# Patient Record
Sex: Female | Born: 1975
Health system: Southern US, Community
[De-identification: ages and names within clinical notes are randomized; demographics above are authoritative.]

## PROBLEM LIST (undated history)

## (undated) ENCOUNTER — Emergency Department: Discharge status: Absent without leave | Payer: Self-pay

## (undated) DIAGNOSIS — F32A Depression, unspecified: Secondary | ICD-10-CM

## (undated) DIAGNOSIS — E079 Disorder of thyroid, unspecified: Secondary | ICD-10-CM

## (undated) DIAGNOSIS — F329 Major depressive disorder, single episode, unspecified: Secondary | ICD-10-CM

## (undated) HISTORY — PX: HERNIA REPAIR: SHX51

## (undated) HISTORY — PX: OTHER SURGICAL HISTORY: SHX169

## (undated) HISTORY — PX: APPENDECTOMY: SHX54

---

## 1997-06-29 ENCOUNTER — Encounter: Admission: RE | Admit: 1997-06-29 | Discharge: 1997-06-29 | Payer: Self-pay | Admitting: Hematology and Oncology

## 1997-12-06 ENCOUNTER — Emergency Department (HOSPITAL_COMMUNITY): Admission: EM | Admit: 1997-12-06 | Discharge: 1997-12-06 | Payer: Self-pay | Admitting: Emergency Medicine

## 1997-12-12 ENCOUNTER — Encounter: Payer: Self-pay | Admitting: Emergency Medicine

## 1997-12-12 ENCOUNTER — Emergency Department (HOSPITAL_COMMUNITY): Admission: EM | Admit: 1997-12-12 | Discharge: 1997-12-12 | Payer: Self-pay | Admitting: Emergency Medicine

## 1998-10-19 ENCOUNTER — Encounter: Admission: RE | Admit: 1998-10-19 | Discharge: 1998-10-19 | Payer: Self-pay | Admitting: Internal Medicine

## 1998-12-29 ENCOUNTER — Emergency Department (HOSPITAL_COMMUNITY): Admission: EM | Admit: 1998-12-29 | Discharge: 1998-12-29 | Payer: Self-pay | Admitting: Emergency Medicine

## 1999-01-23 ENCOUNTER — Encounter: Admission: RE | Admit: 1999-01-23 | Discharge: 1999-01-23 | Payer: Self-pay | Admitting: Internal Medicine

## 1999-02-22 ENCOUNTER — Emergency Department (HOSPITAL_COMMUNITY): Admission: EM | Admit: 1999-02-22 | Discharge: 1999-02-22 | Payer: Self-pay | Admitting: Emergency Medicine

## 1999-02-22 ENCOUNTER — Encounter: Payer: Self-pay | Admitting: Emergency Medicine

## 1999-07-03 ENCOUNTER — Emergency Department (HOSPITAL_COMMUNITY): Admission: EM | Admit: 1999-07-03 | Discharge: 1999-07-03 | Payer: Self-pay | Admitting: Emergency Medicine

## 1999-07-03 ENCOUNTER — Encounter: Payer: Self-pay | Admitting: Emergency Medicine

## 1999-11-26 ENCOUNTER — Inpatient Hospital Stay (HOSPITAL_COMMUNITY): Admission: AD | Admit: 1999-11-26 | Discharge: 1999-11-26 | Payer: Self-pay | Admitting: Obstetrics & Gynecology

## 1999-12-30 ENCOUNTER — Other Ambulatory Visit: Admission: RE | Admit: 1999-12-30 | Discharge: 1999-12-30 | Payer: Self-pay | Admitting: Obstetrics and Gynecology

## 2000-01-07 ENCOUNTER — Ambulatory Visit (HOSPITAL_COMMUNITY): Admission: RE | Admit: 2000-01-07 | Discharge: 2000-01-07 | Payer: Self-pay | Admitting: *Deleted

## 2000-01-07 ENCOUNTER — Encounter: Payer: Self-pay | Admitting: *Deleted

## 2000-01-14 ENCOUNTER — Encounter: Payer: Self-pay | Admitting: *Deleted

## 2000-01-14 ENCOUNTER — Ambulatory Visit (HOSPITAL_COMMUNITY): Admission: RE | Admit: 2000-01-14 | Discharge: 2000-01-14 | Payer: Self-pay | Admitting: *Deleted

## 2000-02-12 ENCOUNTER — Inpatient Hospital Stay (HOSPITAL_COMMUNITY): Admission: AD | Admit: 2000-02-12 | Discharge: 2000-02-12 | Payer: Self-pay | Admitting: Obstetrics and Gynecology

## 2000-02-12 ENCOUNTER — Encounter: Payer: Self-pay | Admitting: Emergency Medicine

## 2000-05-21 ENCOUNTER — Inpatient Hospital Stay (HOSPITAL_COMMUNITY): Admission: AD | Admit: 2000-05-21 | Discharge: 2000-05-21 | Payer: Self-pay | Admitting: Obstetrics and Gynecology

## 2000-06-09 ENCOUNTER — Encounter: Payer: Self-pay | Admitting: Obstetrics & Gynecology

## 2000-06-09 ENCOUNTER — Ambulatory Visit (HOSPITAL_COMMUNITY): Admission: RE | Admit: 2000-06-09 | Discharge: 2000-06-09 | Payer: Self-pay | Admitting: Obstetrics & Gynecology

## 2000-06-11 ENCOUNTER — Inpatient Hospital Stay (HOSPITAL_COMMUNITY): Admission: AD | Admit: 2000-06-11 | Discharge: 2000-06-15 | Payer: Self-pay | Admitting: Obstetrics and Gynecology

## 2000-06-17 ENCOUNTER — Observation Stay (HOSPITAL_COMMUNITY): Admission: AD | Admit: 2000-06-17 | Discharge: 2000-06-18 | Payer: Self-pay | Admitting: Obstetrics and Gynecology

## 2000-07-09 ENCOUNTER — Encounter: Admission: RE | Admit: 2000-07-09 | Discharge: 2000-08-08 | Payer: Self-pay | Admitting: Obstetrics and Gynecology

## 2001-02-10 ENCOUNTER — Emergency Department (HOSPITAL_COMMUNITY): Admission: EM | Admit: 2001-02-10 | Discharge: 2001-02-10 | Payer: Self-pay | Admitting: *Deleted

## 2008-02-19 ENCOUNTER — Ambulatory Visit: Payer: Self-pay | Admitting: Family Medicine

## 2008-02-19 DIAGNOSIS — N3000 Acute cystitis without hematuria: Secondary | ICD-10-CM | POA: Insufficient documentation

## 2008-02-19 DIAGNOSIS — E039 Hypothyroidism, unspecified: Secondary | ICD-10-CM | POA: Insufficient documentation

## 2008-02-19 DIAGNOSIS — F411 Generalized anxiety disorder: Secondary | ICD-10-CM | POA: Insufficient documentation

## 2008-02-19 LAB — CONVERTED CEMR LAB
Bilirubin Urine: NEGATIVE
Glucose, Urine, Semiquant: NEGATIVE
Ketones, urine, test strip: NEGATIVE
Nitrite: NEGATIVE
Protein, U semiquant: 30
Specific Gravity, Urine: 1.02
Urobilinogen, UA: 0.2
pH: 7

## 2008-02-20 ENCOUNTER — Encounter: Payer: Self-pay | Admitting: Family Medicine

## 2008-04-21 ENCOUNTER — Ambulatory Visit: Payer: Self-pay | Admitting: Family Medicine

## 2008-04-21 DIAGNOSIS — R209 Unspecified disturbances of skin sensation: Secondary | ICD-10-CM | POA: Insufficient documentation

## 2008-04-22 ENCOUNTER — Encounter: Payer: Self-pay | Admitting: Family Medicine

## 2008-04-25 ENCOUNTER — Telehealth (INDEPENDENT_AMBULATORY_CARE_PROVIDER_SITE_OTHER): Payer: Self-pay | Admitting: *Deleted

## 2008-04-25 LAB — CONVERTED CEMR LAB
BUN: 11 mg/dL (ref 6–23)
CO2: 25 meq/L (ref 19–32)
Calcium: 9.1 mg/dL (ref 8.4–10.5)
Chloride: 108 meq/L (ref 96–112)
Creatinine, Ser: 0.89 mg/dL (ref 0.40–1.20)
Folate: 18.2 ng/mL
Glucose, Bld: 79 mg/dL (ref 70–99)
Hgb A1c MFr Bld: 5.8 % (ref 4.6–6.1)
Potassium: 4.5 meq/L (ref 3.5–5.3)
Sodium: 141 meq/L (ref 135–145)
Vitamin B-12: 343 pg/mL (ref 211–911)

## 2009-01-11 ENCOUNTER — Ambulatory Visit: Payer: Self-pay | Admitting: Family Medicine

## 2009-01-11 DIAGNOSIS — J209 Acute bronchitis, unspecified: Secondary | ICD-10-CM | POA: Insufficient documentation

## 2009-04-24 ENCOUNTER — Ambulatory Visit: Payer: Self-pay | Admitting: Family Medicine

## 2009-04-24 DIAGNOSIS — J069 Acute upper respiratory infection, unspecified: Secondary | ICD-10-CM | POA: Insufficient documentation

## 2009-09-14 ENCOUNTER — Ambulatory Visit: Payer: Self-pay | Admitting: Emergency Medicine

## 2009-09-14 DIAGNOSIS — M79609 Pain in unspecified limb: Secondary | ICD-10-CM | POA: Insufficient documentation

## 2009-10-01 ENCOUNTER — Encounter: Admission: RE | Admit: 2009-10-01 | Discharge: 2009-10-25 | Payer: Self-pay | Admitting: Sports Medicine

## 2010-03-28 NOTE — Assessment & Plan Note (Signed)
Summary: UTI? ROOM 1   Vital Signs:  Patient Profile:   35 Years Old Female CC:      Dyuria, Frequency in urination x 1 day Height:     67 inches Weight:      267 pounds O2 Sat:      97 % O2 treatment:    Room Air Temp:     97.6 degrees F oral Pulse rate:   72 / minute Pulse rhythm:   regular Resp:     12 per minute BP sitting:   120 / 74  (left arm) Cuff size:   regular  Vitals Entered By: Avel SensorMandy Lerner, CMA                 History of Present Illness Chief Complaint: Dyuria, Frequency in urination x 1 day History of Present Illness: Subjective:  Patient presents complaining of UTI symptoms for 2 days.  Complains of dysuria, frequency, and urgency.  +  hematuria.   No abnormal vaginal discharge.  No fever/chills/sweats.  No abdominal pain.  No flank pain.  No nausea/vomiting.  Last menstrual period normal.     Current Meds SYNTHROID 125 MCG TABS (LEVOTHYROXINE SODIUM) 1 bid PAXIL 40 MG TABS (PAROXETINE HCL) 1 qd NITROFURANTOIN MONOHYD MACRO 100 MG CAPS (NITROFURANTOIN MONOHYD MACRO) 1 by mouth two times a day for one week PYRIDIUM 100 MG TABS (PHENAZOPYRIDINE HCL) 1 by mouth three times a day pc   REVIEW OF SYSTEMS Constitutional Symptoms      Denies fever, chills, night sweats, weight loss, weight gain, and fatigue.  Eyes       Denies change in vision, eye pain, eye discharge, glasses, contact lenses, and eye surgery. Ear/Nose/Throat/Mouth       Denies hearing loss/aids, change in hearing, ear pain, ear discharge, dizziness, frequent runny nose, frequent nose bleeds, sinus problems, sore throat, hoarseness, and tooth pain or bleeding.  Respiratory       Denies dry cough, productive cough, wheezing, shortness of breath, asthma, bronchitis, and emphysema/COPD.  Cardiovascular       Denies murmurs, chest pain, and tires easily with exhertion.    Gastrointestinal       Denies stomach pain, nausea/vomiting, diarrhea, constipation, blood in bowel movements, and  indigestion. Genitourniary       Complains of painful urination.      Denies kidney stones and loss of urinary control. Neurological       Denies paralysis, seizures, and fainting/blackouts. Musculoskeletal       Denies muscle pain, joint pain, joint stiffness, decreased range of motion, redness, swelling, muscle weakness, and gout.  Skin       Denies bruising, unusual mles/lumps or sores, and hair/skin or nail changes.  Psych       Denies mood changes, temper/anger issues, anxiety/stress, speech problems, depression, and sleep problems.  Past Medical History:    Anxiety    Hypothyroidism  Past Surgical History:    Appendectomy    Caesarean section   Family History:    Mother, Healthy    Father,D Lung CA    Brother, Healthy  Social History:    1 ppd Smoker    2 alcohol a day    No drugs  Objective:   No acute distress, appears comfortable. Mouth:  moist mucous membranes Lungs:  Clear to auscultation.  Breath sounds are equal.  Heart:  Regular rate and rhythm without murmurs, rubs, or gallops.  Abdomen:  No tenderness to deep palpation. No rebound or guarding.  Non-distended.  Active bowel sounds.  No CVA or flank tenderness.  No masses or hepatosplenomegaly.  Urinalysis reviewed.     Assessment New Problems: ACUTE CYSTITIS (ICD-595.0) HYPOTHYROIDISM (ICD-244.9) ANXIETY (ICD-300.00)   Plan New Medications/Changes: PYRIDIUM 100 MG TABS (PHENAZOPYRIDINE HCL) 1 by mouth three times a day pc  #6 x 0, 02/19/2008, Donna Christen MD NITROFURANTOIN MONOHYD MACRO 100 MG CAPS (NITROFURANTOIN MONOHYD MACRO) 1 by mouth two times a day for one week  #14 x 0, 02/19/2008, Donna Christen MD  New Orders: T-Culture, Urine [[id number]] T-Urinalysis Dipstick only [46962XB] New Patient Level II [28413] Planning Comments:   Urine culture pending   The patient and/or caregiver has been counseled thoroughly with regard to medications prescribed including dosage, schedule,  interactions, rationale for use, and possible side effects and they verbalize understanding.  Diagnoses and expected course of recovery discussed and will return if not improved as expected or if the condition worsens. Patient and/or caregiver verbalized understanding.    Prescriptions: PYRIDIUM 100 MG TABS (PHENAZOPYRIDINE HCL) 1 by mouth three times a day pc  #6 x 0   Entered and Authorized by:   Donna Christen MD   Signed by:   Donna Christen MD on 02/19/2008   Method used:   Print then Give to Patient   RxID:   [id number] NITROFURANTOIN MONOHYD MACRO 100 MG CAPS (NITROFURANTOIN MONOHYD MACRO) 1 by mouth two times a day for one week  #14 x 0   Entered and Authorized by:   Donna Christen MD   Signed by:   Donna Christen MD on 02/19/2008   Method used:   Print then Give to Patient   RxID:   [id number]    Patient Instructions: 1)  Increase fluid intake 2)  Return for recheck if not improving 4 to 5 days or if symptoms become worse.  ] ] Laboratory Results   Urine Tests  Date/Time Received: February 19, 2008 5:41 PM  Date/Time Reported: February 19, 2008 5:41 PM   Routine Urinalysis   Color: yellow Appearance: Clear Glucose: negative   (Normal Range: Negative) Bilirubin: negative   (Normal Range: Negative) Ketone: negative   (Normal Range: Negative) Spec. Gravity: 1.020   (Normal Range: 1.003-1.035) Blood: moderate   (Normal Range: Negative) pH: 7.0   (Normal Range: 5.0-8.0) Protein: 30   (Normal Range: Negative) Urobilinogen: 0.2   (Normal Range: 0-1) Nitrite: negative   (Normal Range: Negative) Leukocyte Esterace: large   (Normal Range: Negative)      Emilio Math  February 19, 2008 5:42 PM

## 2010-03-28 NOTE — Assessment & Plan Note (Signed)
Summary: SORE THROAT, COUGH, CONGESTION/TJ   Vital Signs:  Patient Profile:   35 Years Old Female CC:      sore throat, productive cough, runny nose, bilateral ear pain X 6 days Height:     67 inches Weight:      246 pounds O2 Sat:      97 % O2 treatment:    Room Air Temp:     98.1 degrees F oral Pulse rate:   69 / minute Pulse rhythm:   regular Resp:     16 per minute BP sitting:   144 / 85  (right arm) Cuff size:   regular  Pt. in pain?   no  Vitals Entered By: Lajean SaverKelsey Lambert RN (April 24, 2009 4:49 PM)                   Updated Prior Medication List: SYNTHROID 125 MCG TABS (LEVOTHYROXINE SODIUM) 1 bid PAXIL 40 MG TABS (PAROXETINE HCL) 1 qd WELLBUTRIN SR 150 MG XR12H-TAB (BUPROPION HCL) 1 qd  Current Allergies (reviewed today): ! PENICILLIN ! SULFA ! AMOXICILLINHistory of Present Illness Chief Complaint: sore throat, productive cough, runny nose, bilateral ear pain X 6 days History of Present Illness: Subjective: Patient complains of URI symptoms that started about one week ago.   + mild sore throat, improved + cough worse at night No pleuritic pain ? wheezing + nasal congestion + post-nasal drainage ? sinus pain/pressure No itchy/red eyes No earache No hemoptysis Mild SOB No fever, + chills No nausea No vomiting No abdominal pain No diarrhea No skin rashes + fatigue No myalgias No headache Used OTC meds without relief   REVIEW OF SYSTEMS Constitutional Symptoms       Complains of chills, night sweats, and fatigue.     Denies fever, weight loss, and weight gain.  Eyes       Denies change in vision, eye pain, eye discharge, glasses, contact lenses, and eye surgery. Ear/Nose/Throat/Mouth       Complains of ear pain, frequent runny nose, sinus problems, and sore throat.      Denies hearing loss/aids, change in hearing, ear discharge, dizziness, frequent nose bleeds, hoarseness, and tooth pain or bleeding.  Respiratory       Complains of productive  cough and wheezing.      Denies dry cough, shortness of breath, asthma, bronchitis, and emphysema/COPD.  Cardiovascular       Denies murmurs, chest pain, and tires easily with exhertion.    Gastrointestinal       Denies stomach pain, nausea/vomiting, diarrhea, constipation, blood in bowel movements, and indigestion. Genitourniary       Denies painful urination, kidney stones, and loss of urinary control. Neurological       Denies paralysis, seizures, and fainting/blackouts. Musculoskeletal       Denies muscle pain, joint pain, joint stiffness, decreased range of motion, redness, swelling, muscle weakness, and gout.  Skin       Denies bruising, unusual mles/lumps or sores, and hair/skin or nail changes.  Psych       Denies mood changes, temper/anger issues, anxiety/stress, speech problems, depression, and sleep problems.  Past History:  Past Medical History: Anxiety Hypothyroidism frequent bronchitis  Past Surgical History: Appendectomy Caesarean section Inguinal herniorrhaphy  Family History: Reviewed history from 02/19/2008 and no changes required. Mother, Healthy Father,D Lung CA Brother, Healthy  Social History: 1 ppd Smoker 1 alcohol per week No drugs   Objective:  Appearance:  Patient appears healthy, stated  age, and in no acute distress  Eyes:  Pupils are equal, round, and reactive to light and accomdation.  Extraocular movement is intact.  Conjunctivae are not inflamed.  Ears:  Canals normal.  Tympanic membranes normal.   Nose:  Normal septum.  Normal turbinates, mildly congested.  No sinus tenderness present.  Pharynx:  Normal  Neck:  Supple.  No adenopathy is present.  No thyromegaly is present  Lungs:   Faint scattered wheezes posteriorly.   Breath sounds are equal.  Heart:  Regular rate and rhythm without murmurs, rubs, or gallops.  Abdomen:  Nontender without masses or hepatosplenomegaly.  Bowel sounds are present.  No CVA or flank tenderness.      Assessment New Problems: URI (ICD-465.9)   Plan New Medications/Changes: BENZONATATE 200 MG CAPS (BENZONATATE) One by mouth hs as needed cough  #12 x 0, 04/24/2009, Donna Christen MD AZITHROMYCIN 250 MG TABS (AZITHROMYCIN) Two tabs by mouth on day 1, then 1 tab daily on days 2 through 5  #6 tabs x 0, 04/24/2009, Donna Christen MD  New Orders: Est. Patient Level III [phone] Planning Comments:   Begin Z-pack, expectorant/decongestant, rest, fluids, cough suppressant at bedtime. Follow-up with PCP if not improving.   The patient and/or caregiver has been counseled thoroughly with regard to medications prescribed including dosage, schedule, interactions, rationale for use, and possible side effects and they verbalize understanding.  Diagnoses and expected course of recovery discussed and will return if not improved as expected or if the condition worsens. Patient and/or caregiver verbalized understanding.  Prescriptions: BENZONATATE 200 MG CAPS (BENZONATATE) One by mouth hs as needed cough  #12 x 0   Entered and Authorized by:   Donna Christen MD   Signed by:   Donna Christen MD on 04/24/2009   Method used:   Print then Give to Patient   RxID:   [id number] AZITHROMYCIN 250 MG TABS (AZITHROMYCIN) Two tabs by mouth on day 1, then 1 tab daily on days 2 through 5  #6 tabs x 0   Entered and Authorized by:   Donna Christen MD   Signed by:   Donna Christen MD on 04/24/2009   Method used:   Print then Give to Patient   RxID:   [id number]   Patient Instructions: 1)  May use Mucinex D (guaifenesin with decongestant) twice daily for congestion. 2)  Increase fluid intake, rest. 3)  May use Afrin nasal spray (or generic oxymetazoline) twice daily for about 5 days.  Also recommend using saline nasal spray several times daily and/or saline nasal irrigation. 4)  Followup with family doctor if not improving one week.

## 2010-03-28 NOTE — Progress Notes (Signed)
  Phone Note Outgoing Call   Call placed by: Loel Ro, RN Call placed to: [employer] Summary of Call: Call made to Ms. Windt at request of Dr. Cathren Harsh.  No answer.  Left message for patient to return call.    Per Dr. Cathren Harsh, at this point, unable to add CBC to blood previously collected.  Per Dr. Cathren Harsh, patient can come into office and have blood re-drawn for CBC or can wait until her apt with Neurologist and have blood work done as ordered by Insurance account manager.

## 2010-03-28 NOTE — Letter (Signed)
Summary: Out of School  [facility]  [address]   [place], [place] [address]   Phone: [phone]  Fax: [phone]    January 11, 2009   Student:  Tracy Carr    To Whom It May Concern:   For Medical reasons, please excuse the above named student from school for the following dates:  Start:   January 11, 2009  End:    15 Jan 2009  If you need additional information, please feel free to contact our office.   Sincerely,    Marvis Moeller DO    ****This is a legal document and cannot be tampered with.  Schools are authorized to verify all information and to do so accordingly.
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
## 2010-03-28 NOTE — Assessment & Plan Note (Signed)
Summary: NUMB FINGERS/KH   Vital Signs:  Patient Profile:   35 Years Old Female CC:      Left hand all finger numb, Rt hand thumd and 1st finger X 3 weeks Height:     67 inches Weight:      264 pounds O2 Sat:      97 % O2 treatment:    Room Air Temp:     99.4 degrees F oral Pulse rate:   80 / minute Pulse rhythm:   regular Resp:     12 per minute BP sitting:   145 / 90  (right arm) Cuff size:   large  Vitals Entered By: Emilio Math (April 21, 2008 11:00 AM)                 History of Present Illness Chief Complaint: Left hand all finger numb, Rt hand thumd and 1st finger X 3 weeks History of Present Illness: Subjective:  Patient complains of several month history of vague tingling sensation in her left hand thumb and all fingertips, worse in the thumb.  In her right hand she has had tingling in the thumb and 2nd/3rd fingers.  She reports long history (years) of tingling in her great toes.  For the past several weeks she also has had a vague sense of vibration in her anterior thighs.  She feels well otherwise.  She denies increased thirs or polyuria although she states that she drinks a lot of water.  She has a history of hypothyroid but states that her TSH has been normal on her present dose of Synthroid.  Remainder of ROS is negative.  She has a family history of diabetes in her mother.  Current Meds SYNTHROID 125 MCG TABS (LEVOTHYROXINE SODIUM) 1 bid PAXIL 40 MG TABS (PAROXETINE HCL) 1 qd WELLBUTRIN SR 150 MG XR12H-TAB (BUPROPION HCL) 1 qd   REVIEW OF SYSTEMS Constitutional Symptoms      Denies fever, chills, night sweats, weight loss, weight gain, and fatigue.  Eyes       Denies change in vision, eye pain, eye discharge, glasses, contact lenses, and eye surgery. Ear/Nose/Throat/Mouth       Denies hearing loss/aids, change in hearing, ear pain, ear discharge, dizziness, frequent runny nose, frequent nose bleeds, sinus problems, sore throat, hoarseness, and tooth pain  or bleeding.  Respiratory       Denies dry cough, productive cough, wheezing, shortness of breath, asthma, bronchitis, and emphysema/COPD.  Cardiovascular       Denies murmurs, chest pain, and tires easily with exhertion.    Gastrointestinal       Denies stomach pain, nausea/vomiting, diarrhea, constipation, blood in bowel movements, and indigestion. Genitourniary       Denies painful urination, kidney stones, and loss of urinary control. Neurological       Complains of headaches and numbness.      Denies paralysis, seizures, and fainting/blackouts. Musculoskeletal       Denies muscle pain, joint pain, joint stiffness, decreased range of motion, redness, swelling, muscle weakness, and gout.  Skin       Denies bruising, unusual mles/lumps or sores, and hair/skin or nail changes.  Psych       Denies mood changes, temper/anger issues, anxiety/stress, speech problems, depression, and sleep problems.  Past Medical History:    Reviewed history from 02/19/2008 and no changes required:       Anxiety       Hypothyroidism  Past Surgical History:    Reviewed history  from 02/19/2008 and no changes required:       Appendectomy       Caesarean section   Family History:    Reviewed history from 02/19/2008 and no changes required:       Mother, Healthy       Father,D Lung CA       Brother, Healthy  Social History:    Reviewed history from 02/19/2008 and no changes required:       1 ppd Smoker       2 alcohol a day       No drugs  Objective:  No acute distress.  BMI 41.4 Eyes:  Pupils are equal, round, and reactive to light and accomdation.  Extraocular movement is intact.  Conjunctivae are not inflamed.  Pharynx:  normal Neck:  Supple.  No adenopathy is present.   Lungs:  Clear to auscultation.  Breath sounds are equal.  Heart:  Regular rate and rhythm without murmurs, rubs, or gallops.  Abdomen:  Nontender without masses or hepatosplenomegaly.  Bowel sounds are present.  No CVA or  flank tenderness.  Neurologic:  Cranial nerves 2 through 12 are normal.  Patellar reflexes are normal.  Cerebellar function is intact.    There is decreased sensation to light touch in her great toes, distal thumbs bilaterally, 2nd and 3rd fingertips right hand, and 2nd through 5th fingertips left hand. Extremities:  pulses intact    Assessment New Problems: DISTURBANCE OF SKIN SENSATION (ICD-782.0)  Suspect diabetic neuropathy.  Plan New Orders: Est. Patient Level III [99213] T-Basic Metabolic Panel [80048-22910] T-Hgb A1C [83036-23375] T-B12, Serum Total Only [82607] T-Folate [23340] Planning Comments:   Check BMP. (patient is about 2 hours PC), Hgb A1C, B12 and folate levels Recommend weight loss.  Continue and increase regular physical activity. follow-up with family doctor.   The patient and/or caregiver has been counseled thoroughly with regard to medications prescribed including dosage, schedule, interactions, rationale for use, and possible side effects and they verbalize understanding.  Diagnoses and expected course of recovery discussed and will return if not improved as expected or if the condition worsens. Patient and/or caregiver verbalized understanding.     ] ]

## 2010-03-28 NOTE — Assessment & Plan Note (Signed)
Summary: Cough-dry, chest congestion, sinus pressure x 8-9 dys rm 2   Vital Signs:  Patient Profile:   35 Years Old Female CC:      Cold & URI symptoms Height:     67 inches Weight:      244 pounds O2 Sat:      98 % O2 treatment:    Room Air Temp:     97.7 degrees F oral Pulse rate:   76 / minute Pulse rhythm:   regular Resp:     16 per minute BP sitting:   125 / 85  (right arm) Cuff size:   regular  Vitals Entered By: Areta Haber CMA (January 11, 2009 12:17 PM)                  Current Allergies (reviewed today): ! PENICILLIN ! SULFA ! AMOXICILLIN   History of Present Illness Chief Complaint: Cold & URI symptoms History of Present Illness: ONSET 1 WK AGO WITH COUGH AND CONGESTION. COUGH IS DRY AND WORSE AT NIGHT. HAS TAKEN MUCINEX WITH MINIMAL RELEIF. NO FEVER. HAS CHROINC SINUS PROBLEMS. NO SORE THROAT. CHEST HURTS WITH COUGHING. NO N/V/D. ADMITS TO FREQUENT SWEATS.  Current Problems: BRONCHITIS, ACUTE (ICD-466.0) DISTURBANCE OF SKIN SENSATION (ICD-782.0) ACUTE CYSTITIS (ICD-595.0) HYPOTHYROIDISM (ICD-244.9) ANXIETY (ICD-300.00)   Current Meds SYNTHROID 125 MCG TABS (LEVOTHYROXINE SODIUM) 1 bid PAXIL 40 MG TABS (PAROXETINE HCL) 1 qd WELLBUTRIN SR 150 MG XR12H-TAB (BUPROPION HCL) 1 qd ZITHROMAX Z-PAK 250 MG TABS (AZITHROMYCIN) TAKE AS DIRECTED * ALBUTEROL INHALER HFA 2 PUFFS QID as needed WHEEZING CHERATUSSIN AC 100-10 MG/5ML SYRP (GUAIFENESIN-CODEINE) 1-2 TSP by mouth Q 6 HRS as needed COUGH MEDROL (PAK) 4 MG TABS (METHYLPREDNISOLONE) TAKE AS DIRECTED WITH FOOD  REVIEW OF SYSTEMS Constitutional Symptoms      Denies fever, chills, night sweats, weight loss, weight gain, and fatigue.  Eyes       Denies change in vision, eye pain, eye discharge, glasses, contact lenses, and eye surgery. Ear/Nose/Throat/Mouth       Complains of frequent runny nose and sinus problems.      Denies hearing loss/aids, change in hearing, ear pain, ear discharge, dizziness,  frequent nose bleeds, sore throat, hoarseness, and tooth pain or bleeding.      Comments: x 8-9 dys Respiratory       Complains of dry cough and wheezing.      Denies productive cough, shortness of breath, asthma, bronchitis, and emphysema/COPD.  Cardiovascular       Denies murmurs, chest pain, and tires easily with exhertion.    Gastrointestinal       Denies stomach pain, nausea/vomiting, diarrhea, constipation, blood in bowel movements, and indigestion. Genitourniary       Denies painful urination, kidney stones, and loss of urinary control. Neurological       Denies paralysis, seizures, and fainting/blackouts. Musculoskeletal       Denies muscle pain, joint pain, joint stiffness, decreased range of motion, redness, swelling, muscle weakness, and gout.  Skin       Denies bruising, unusual mles/lumps or sores, and hair/skin or nail changes.  Psych       Denies mood changes, temper/anger issues, anxiety/stress, speech problems, depression, and sleep problems.  Past History:  Past Medical History: Last updated: 02/19/2008 Anxiety Hypothyroidism  Past Surgical History: Last updated: 02/19/2008 Appendectomy Caesarean section  Family History: Last updated: 02/19/2008 Mother, Healthy Father,D Lung CA Brother, Healthy  Social History: Last updated: 02/19/2008 1 ppd Smoker 2 alcohol a day  No drugs Physical Exam General appearance: well developed, well nourished, no acute distress Ears: normal, no lesions or deformities Nasal: CONGESTED Oral/Pharynx: MILD ERYTHEMA Neck: neck supple,  trachea midline, no masses Chest/Lungs: no rales, wheezes, or rhonchi bilateral, breath sounds equal without effort. CONGESTED CROUPY COUGH Heart: regular rate and  rhythm, no murmur Extremities: normal extremities Skin: no obvious rashes or lesions Assessment New Problems: BRONCHITIS, ACUTE (ICD-466.0)   Plan New Medications/Changes: MEDROL (PAK) 4 MG TABS (METHYLPREDNISOLONE) TAKE AS  DIRECTED WITH FOOD  #1 x 0, 01/11/2009, Rafael Quesada DO CHERATUSSIN AC 100-10 MG/5ML SYRP (GUAIFENESIN-CODEINE) 1-2 TSP by mouth Q 6 HRS as needed COUGH  #4 OZ x 0, 01/11/2009, Samari Gorby DO ALBUTEROL INHALER HFA 2 PUFFS QID as needed WHEEZING  #ONE x 0, 01/11/2009, Glady Ouderkirk DO ZITHROMAX Z-PAK 250 MG TABS (AZITHROMYCIN) TAKE AS DIRECTED  #1 PK x 0, 01/11/2009, Marvis MoellerKimberly Ainsley Deakins DO  New Orders: Est. Patient Level III [16109][99213]   Prescriptions: MEDROL (PAK) 4 MG TABS (METHYLPREDNISOLONE) TAKE AS DIRECTED WITH FOOD  #1 x 0   Entered and Authorized by:   Marvis MoellerKimberly Meylin Stenzel DO   Signed by:   Marvis MoellerKimberly Calib Wadhwa DO on 01/11/2009   Method used:   Print then Give to Patient   RxID:   [id number] CHERATUSSIN AC 100-10 MG/5ML SYRP (GUAIFENESIN-CODEINE) 1-2 TSP by mouth Q 6 HRS as needed COUGH  #4 OZ x 0   Entered and Authorized by:   Marvis MoellerKimberly Keionna Kinnaird DO   Signed by:   Marvis MoellerKimberly Kitrina Maurin DO on 01/11/2009   Method used:   Print then Give to Patient   RxID:   [id number] ALBUTEROL INHALER HFA 2 PUFFS QID as needed WHEEZING  #ONE x 0   Entered and Authorized by:   Marvis MoellerKimberly Danilo Cappiello DO   Signed by:   Marvis MoellerKimberly Jaycey Gens DO on 01/11/2009   Method used:   Print then Give to Patient   RxID:   [id number] ZITHROMAX Z-PAK 250 MG TABS (AZITHROMYCIN) TAKE AS DIRECTED  #1 PK x 0   Entered and Authorized by:   Marvis MoellerKimberly Brynlei Klausner DO   Signed by:   Marvis MoellerKimberly Brissa Asante DO on 01/11/2009   Method used:   Print then Give to Patient   Rx:   [id number]:   [id number]   Patient Instructions: 1)  TYLENOL OR MOTRIN AS NEEDED. AVOID CAFFEINE AND MILK PRODUCTS. FOLLOW UP WITH YOUR PCP OR RETURN IF SYMPTOMS FAIL TO RESOLVE OR WORSEN.

## 2010-07-12 NOTE — Op Note (Signed)
[facility]  Patient:    Tracy Carr, Tracy Carr                     MRN: [id number] Proc. Date: 06/11/00 Adm. Date:  [id number] Attending:  Miguel Aschoff                           Operative Report  PREOPERATIVE DIAGNOSIS:       Intrauterine pregnancy at 38 weeks with suspected macrosomia.  POSTOPERATIVE DIAGNOSIS:      Intrauterine pregnancy at 38 weeks with suspected macrosomia with viable female infant, Apgars 8/9.  OPERATION:                    Primary low transverse cesarean section.  SURGEON:                      Miguel Aschoff, M.D.  ASSISTANT:                    Gerrit Friends. Aldona Bar, M.D.  ANESTHESIA:                   Spinal.  COMPLICATIONS:                None.  JUSTIFICATION:                The patient is a 35 year old white female at [redacted] weeks gestation, noted to have a markedly large uterine fundus.  Ultrasound examination was carried out and revealed an estimated fetal weight of approximately 10 pounds.  In view of the suspected macrosomia, the options of the delivery route were discussed with the patient, and the patient elected to undergo elective primary cesarean section for the suspected macrosomia.  Risks and benefits have been discussed.  DESCRIPTION OF PROCEDURE:     The patient was taken to the operating room, placed in the sitting position, and spinal anesthesia was administered without difficulty.  She was then placed in supine position deviated to the left and prepped and draped in the usual sterile fashion.  After this was done, a Pfannenstiel incision was made, extended down through the subcutaneous tissue with bleeding points being clamped and coagulated as they were encountered. The fascia was then identified and incised transversely.  The fascia was then separated from the underlying rectus muscles.  The rectus muscles were divided in the midline, and the peritoneum was then identified and entered carefully, avoiding underlying  structures.  A bladder flap was then created and protected with the bladder blade.  A elliptical transverse incision was made in the lower uterine segment.  The amniotic cavity was entered.  Clear fluid was obtained, and then the patient was delivered of a viable female infant, Apgars 8 at 1 minute and 9 at 5 minutes from a vertex LOA position.  Nuchal cord x 1 was noted.  The babys weight was 9 pounds 10 ounces.  After delivery of the infant, cord was doubly clamped and cut, and the baby was handed to the pediatric team in attendance.  Cord bloods were then obtained for appropriate studies.  The placenta was then delivered, and the uterus was evacuated of any products of conception.  The uterine incision was then ligated using figure-of-eight sutures of #1 Vicryl and the uterus was closed in layers.  The first layer was running interlocking suture of #1 Vicryl followed by an imbricating suture of #1  Vicryl.  Then the bladder flap was reapproximated using running continuous 2-0 Vicryl suture.  After this was done, there appeared to be excellent hemostasis.  The abdomen was irrigated with warm saline.  Tubes and ovaries were inspected and noted to be within normal limits, and then the abdomen was closed.  The parietal peritoneum was closed using running continuous 0 Vicryl suture.  The rectus muscles were reapproximated using running continuous 0 Vicryl suture.  The fascia was then closed using two sutures of 0 Vicryl, each starting at the lateral fascial angles and meeting at the midline.  Subcutaneous tissue was closed using interrupted 0 Vicryl suture, and then the skin incision was closed using staples.  The estimated blood loss was approximately 800 cc.  The patient tolerated the procedure well and went to the recovery room in satisfactory condition. DD:  06/11/00 TD:  06/11/00 Job: [id number]

## 2010-07-12 NOTE — Discharge Summary (Signed)
[facility]  Patient:    Tracy Carr, Tracy Carr                     MRN: [id number] Adm. Date:  [id number] Disc. Date: [id number] Attending:  Osborn CohoAnderson, Mark Edward                           Discharge Summary  PREOPERATIVE DIAGNOSES:       Intrauterine pregnancy at term, suspected macrosomia.  FINAL DIAGNOSES:              Intrauterine pregnancy at term, suspected macrosomia with delivery of viable female infant Apgars 8/9 weighing 9 pounds 10 ounces.  PROCEDURE:                    Primary low flap transverse cesarean section, spinal anesthesia.  BRIEF HISTORY:                The patient is a 35 year old white female gravida 2, para 0-1-0-0 who was noted to have a large uterine fundus and ultrasound examination was carried out which revealed suspected macrosomia. The patient was given the options of an attempt at vaginal birth versus an elective primary cesarean section.  After being counseled as to the risks and benefits of each procedure the patient has elected to undergo primary cesarean section.  HOSPITAL COURSE:              Preoperative studies were obtained.  This revealed admission hemoglobin of 10.0, white count of 10,200.  PT and PTT were within normal limits.  Chemistry profile showed elevation of the alkaline phosphatase at 152.  Other laboratories were normal except for an elevated TSH at 17.8.  On April 18 under spinal anesthesia a primary low flap transverse cesarean section was carried out with delivery of a viable female infant Apgar 8 at one minute and 9 at five minutes.  Babys weight was 9 pounds 10 ounces. The patients postoperative course was essentially uncomplicated.  She tolerated increasing ambulation and diet well.  Hemoglobin did drop to 9.1 and the patient was started on iron therapy.  By April 22 the patient was in satisfactory condition and could be discharged home.  DISCHARGE MEDICATIONS:        1. Ferrous sulfate 325 mg one  b.i.d.                               2. Tylox one q.3h. p.r.n. pain.  DISCHARGE INSTRUCTIONS:       No heavy lifting.  Placing nothing in the vagina.  Call if there are any problems such as fever, pain, or heavy bleeding.  FOLLOW-UP:                    The patient will be seen back in the office in four weeks for followup examination.  At that time she will also have evaluation of her thyroid status carried out. DD:  07/14/00 TD:  07/14/00 Job: [id number]

## 2010-11-30 ENCOUNTER — Encounter: Payer: Self-pay | Admitting: Family Medicine

## 2010-11-30 ENCOUNTER — Inpatient Hospital Stay (INDEPENDENT_AMBULATORY_CARE_PROVIDER_SITE_OTHER)
Admission: RE | Admit: 2010-11-30 | Discharge: 2010-11-30 | Disposition: A | Discharge status: Home / Self Care | Payer: Medicaid Other | Source: Ambulatory Visit | Attending: Family Medicine | Admitting: Family Medicine

## 2010-11-30 DIAGNOSIS — J209 Acute bronchitis, unspecified: Secondary | ICD-10-CM

## 2011-01-27 NOTE — Progress Notes (Signed)
Summary: bronchitis?/TM   Vital Signs:  Patient Profile:   35 Years Old Female CC:      Cough Height:     67 inches Weight:      267 pounds O2 Sat:      98 % O2 treatment:    Room Air Temp:     98.3 degrees F oral Pulse rate:   57 / minute Resp:     18 per minute BP sitting:   139 / 84  (left arm) Cuff size:   large  Vitals Entered By: Linton Flemings RN (November 30, 2010 10:05 AM)                  Updated Prior Medication List: SYNTHROID 125 MCG TABS (LEVOTHYROXINE SODIUM) 1 bid PAXIL 40 MG TABS (PAROXETINE HCL) 1 qd WELLBUTRIN SR 150 MG XR12H-TAB (BUPROPION HCL) 1 qd  Current Allergies (reviewed today): ! PENICILLIN ! SULFA ! AMOXICILLINHistory of Present Illness Chief Complaint: Cough History of Present Illness: 35 yo F smoker with reports of several episodes bronchitis, pneumonia in past presents with 1 week of dry cough and fatigue, night sweats.  No fever.  + wheezing.  No shortness of breath or chest pain.  No known sick contacts  REVIEW OF SYSTEMS Constitutional Symptoms      Denies fever, chills, night sweats, weight loss, weight gain, and fatigue.  Eyes       Denies change in vision, eye pain, eye discharge, glasses, contact lenses, and eye surgery. Ear/Nose/Throat/Mouth       Denies hearing loss/aids, change in hearing, ear pain, ear discharge, dizziness, frequent runny nose, frequent nose bleeds, sinus problems, sore throat, hoarseness, and tooth pain or bleeding.  Respiratory       Complains of dry cough, wheezing, and bronchitis.      Denies productive cough, shortness of breath, asthma, and emphysema/COPD.  Cardiovascular       Complains of tires easily with exhertion.      Denies murmurs and chest pain.    Gastrointestinal       Denies stomach pain, nausea/vomiting, diarrhea, constipation, blood in bowel movements, and indigestion. Genitourniary       Denies painful urination, kidney stones, and loss of urinary control. Neurological       Denies  paralysis, seizures, and fainting/blackouts. Musculoskeletal       Denies muscle pain, joint pain, joint stiffness, decreased range of motion, redness, swelling, muscle weakness, and gout.  Skin       Denies bruising, unusual mles/lumps or sores, and hair/skin or nail changes.  Psych       Denies mood changes, temper/anger issues, anxiety/stress, speech problems, depression, and sleep problems. Other Comments: cough started Saturday   Past History:  Past Surgical History: Last updated: 04/24/2009 Appendectomy Caesarean section Inguinal herniorrhaphy  Family History: Last updated: 02/19/2008 Mother, Healthy Father,D Lung CA Brother, Healthy  Social History: Last updated: 09/14/2009 1/2 ppd Smoker 1 alcohol per week No drugs  Past Medical History: Reviewed history from 09/14/2009 and no changes required. Anxiety Hypothyroidism frequent bronchitis plantar fasciitis  Family History: Reviewed history from 02/19/2008 and no changes required. Mother, Healthy Father,D Lung CA Brother, Healthy  Social History: Reviewed history from 09/14/2009 and no changes required. 1/2 ppd Smoker 1 alcohol per week No drugs Physical Exam General appearance: well developed, well nourished, no acute distress, coughing Head: normocephalic, atraumatic Eyes: conjunctivae and lids normal Pupils: equal, round, reactive to light Ears: normal, no lesions or deformities Nasal: mucosa  pink, nonedematous, no septal deviation, turbinates normal Oral/Pharynx: tongue normal, posterior pharynx without erythema or exudate Neck: neck supple,  trachea midline, no masses Chest/Lungs: minimal wheezes.  No rales or rhonchi bilateral, breath sounds equal without effort.  pulse ox 98% Heart: regular rate and  rhythm, no murmur Assessment New Problems: BRONCHITIS, ACUTE (ICD-466.0)   Plan New Orders: Est. Patient Level III [40981] Planning Comments:   Albuterol neb given today for mild wheezes -  reports improvement, decrease in cough.  Tussionex as needed for cough.  Azithromycin x 5 days.  Discussed OTC medications and handout provided.   The patient and/or caregiver has been counseled thoroughly with regard to medications prescribed including dosage, schedule, interactions, rationale for use, and possible side effects and they verbalize understanding.  Diagnoses and expected course of recovery discussed and will return if not improved as expected or if the condition worsens. Patient and/or caregiver verbalized understanding.   Patient Instructions: 1)  Take antibiotics completely until gone. 2)  Tussionex twice a day as needed for cough - no driving on this medicine as it has a narcotic in it (most people take just at bedtime or if you know you're not going to be driving that day). 3)  Drink plenty of fluids and stay hydrated. 4)  See other handout for other over the counter remedies.  Orders Added: 1)  Est. Patient Level III [19147]  Appended Document: bronchitis?/TM aalbuterol neb tx. lot- [id number] EXP.March 2014 manufactured- [facility] [employer].

## 2011-02-06 ENCOUNTER — Emergency Department
Admit: 2011-02-06 | Discharge: 2011-02-06 | Disposition: A | Discharge status: Home / Self Care | Payer: Medicaid Other | Attending: Family Medicine | Admitting: Family Medicine

## 2011-02-06 ENCOUNTER — Encounter: Payer: Self-pay | Admitting: Emergency Medicine

## 2011-02-06 ENCOUNTER — Emergency Department
Admission: EM | Admit: 2011-02-06 | Discharge: 2011-02-06 | Disposition: A | Discharge status: Home / Self Care | Payer: Self-pay | Source: Home / Self Care | Attending: Family Medicine | Admitting: Family Medicine

## 2011-02-06 DIAGNOSIS — J209 Acute bronchitis, unspecified: Secondary | ICD-10-CM

## 2011-02-06 DIAGNOSIS — J111 Influenza due to unidentified influenza virus with other respiratory manifestations: Secondary | ICD-10-CM

## 2011-02-06 DIAGNOSIS — R6889 Other general symptoms and signs: Secondary | ICD-10-CM

## 2011-02-06 HISTORY — DX: Disorder of thyroid, unspecified: E07.9

## 2011-02-06 HISTORY — DX: Major depressive disorder, single episode, unspecified: F32.9

## 2011-02-06 HISTORY — DX: Depression, unspecified: F32.A

## 2011-02-06 LAB — POCT CBC W AUTO DIFF (K'VILLE URGENT CARE)

## 2011-02-06 MED ORDER — BENZONATATE 200 MG PO CAPS: 200.0000 mg | ORAL_CAPSULE | Freq: Every day | ORAL | Status: AC

## 2011-02-06 MED ORDER — ALBUTEROL SULFATE HFA 108 (90 BASE) MCG/ACT IN AERS: 2.0000 | INHALATION_SPRAY | RESPIRATORY_TRACT | Status: DC | PRN

## 2011-02-06 MED ORDER — OSELTAMIVIR PHOSPHATE 75 MG PO CAPS: 75.0000 mg | ORAL_CAPSULE | Freq: Two times a day (BID) | ORAL | Status: AC

## 2011-02-06 MED ORDER — DOXYCYCLINE HYCLATE 100 MG PO TABS: 100.0000 mg | ORAL_TABLET | Freq: Two times a day (BID) | ORAL | Status: AC

## 2011-02-06 NOTE — ED Provider Notes (Signed)
History     CSN: [id number] Arrival date & time: 02/06/2011  6:00 PM   First MD Initiated Contact with Patient 02/06/11 1815      Chief Complaint  Patient presents with  . Nasal Congestion      HPI Comments: HPI : Flu symptoms started two days ago. Fever with chills, sweats, myalgias, fatigue, headache. Symptoms are progressively worsening, despite trying OTC fever reducing medicine and rest and fluids. Has decreased appetite, but tolerating some liquids by mouth.  She has not had a flu shot. She had bronchitis 3 months ago and has had mild cough since then.  She had pneumonia last year. She is a smoker.  Review of Systems: Positive for fatigue, mild nasal congestion, mild sore throat, mild swollen anterior neck glands, productive cough worse at night, wheezing,  Negative for acute vision changes, stiff neck, focal weakness, syncope, seizures, respiratory distress, vomiting, diarrhea, GU symptoms.   The history is provided by the patient.    Past Medical History  Diagnosis Date  . Thyroid disease   . Depression     Past Surgical History  Procedure Date  . Appendectomy   . Cesarean   . Hernia repair     No family history on file.  History  Substance Use Topics  . Smoking status: Current Everyday Smoker  . Smokeless tobacco: Not on file  . Alcohol Use: Yes    OB History    Grav Para Term Preterm Abortions TAB SAB Ect Mult Living                  Review of Systems  Allergies  Amoxicillin; Penicillins; and Sulfonamide derivatives  Home Medications   Current Outpatient Rx  Name Route Sig Dispense Refill  . LEVONORGESTREL 20 MCG/24HR IU IUD Intrauterine 1 each by Intrauterine route once.      Marland Kitchen LEVOTHYROXINE SODIUM 200 MCG PO TABS Oral Take 275 mcg by mouth daily.      Marland Kitchen PAROXETINE HCL 40 MG PO TABS Oral Take 40 mg by mouth every morning.      . ALBUTEROL SULFATE HFA 108 (90 BASE) MCG/ACT IN AERS Inhalation Inhale 2 puffs into the lungs every 4 (four) hours  as needed for wheezing. 1 Inhaler 0  . BENZONATATE 200 MG PO CAPS Oral Take 1 capsule (200 mg total) by mouth at bedtime. Take as needed for cough 12 capsule 0  . DOXYCYCLINE HYCLATE 100 MG PO TABS Oral Take 1 tablet (100 mg total) by mouth 2 (two) times daily. 20 tablet 0  . OSELTAMIVIR PHOSPHATE 75 MG PO CAPS Oral Take 1 capsule (75 mg total) by mouth every 12 (twelve) hours. 10 capsule 0    BP 131/78  Pulse 68  Temp(Src) 99.3 F (37.4 C) (Oral)  Resp 16  Ht 5\' 7"  (1.702 m)  Wt 275 lb (124.739 kg)  BMI 43.07 kg/m2  SpO2 99%  LMP 07/26/2010  Physical Exam Nursing notes and Vital Signs reviewed. Appearance:  Patient appears healthy, stated age, and in no acute distress.  Patient is morbidly obese (BMI 43.2) Eyes:  Pupils are equal, round, and reactive to light and accomodation.  Extraocular movement is intact.  Conjunctivae are not inflamed  Ears:  Canals normal.  Tympanic membranes normal.  Nose:  Mildly congested turbinates.  No sinus tenderness.    Pharynx:  Normal Neck:  Supple.  Slightly tender shotty posterior nodes are palpated bilaterally  Lungs:  Clear to auscultation.  Breath sounds are equal.  Heart:  Regular rate and rhythm without murmurs, rubs, or gallops.  Abdomen:  Nontender without masses or hepatosplenomegaly.  Bowel sounds are present.  No CVA or flank tenderness.  Extremities:  No edema.  No calf tenderness Skin:  No rash present.   ED Course  Procedures none   Labs Reviewed  POCT CBC W AUTO DIFF ([facility]) CBC:  WBC 9.1; LY 27.4; MO 11.9; GR 60.7; Hgb 14.0    Dg Chest 2 View  02/06/2011  *RADIOLOGY REPORT*  Clinical Data: Cough and fever  CHEST - 2 VIEW  Comparison: None.  Findings: The lungs are clear without focal consolidation, edema, effusion or pneumothorax.  Cardiopericardial silhouette is within normal limits for size.  Imaged bony structures of the thorax are intact.  IMPRESSION: Normal exam.  Original Report Authenticated By: ERIC A.  MANSELL, M.D.     1. Influenza-like illness       MDM  Begin Tamiflu and doxycycline.  Begin albuterol inhaler, cough suppressant at bedtime. Take Mucinex D (guaifenesin with decongestant) twice daily for congestion, or Mucinex and Sudafed.  Increase fluid intake, rest. May use Afrin nasal spray (or generic oxymetazoline) twice daily for about 5 days.  Also recommend using saline nasal spray several times daily and/or saline nasal irrigation. Stop all antihistamines for now, and other non-prescription cough/cold preparations. May take Delsym Cough Suppressant at bedtime for nighttime cough if benzonatate is not effective Recommend flu shot when well      Donna Christen, MD 02/07/11 2216

## 2011-02-06 NOTE — ED Notes (Signed)
Has had intermittant nasal and upper respiratory congestion x 1 month. No Flu vaccine this season. No recent OTCs.

## 2011-02-06 NOTE — Discharge Instructions (Signed)
Take Mucinex D (guaifenesin with decongestant) twice daily for congestion, or Mucinex and Sudafed.  Increase fluid intake, rest. May use Afrin nasal spray (or generic oxymetazoline) twice daily for about 5 days.  Also recommend using saline nasal spray several times daily and/or saline nasal irrigation. Stop all antihistamines for now, and other non-prescription cough/cold preparations. May take Delsym Cough Suppressant at bedtime for nighttime cough if benzonatate is not effective Recommend flu shot when well

## 2011-04-21 ENCOUNTER — Emergency Department
Admission: EM | Admit: 2011-04-21 | Discharge: 2011-04-21 | Disposition: A | Discharge status: Home / Self Care | Payer: Self-pay | Source: Home / Self Care | Attending: Family Medicine | Admitting: Family Medicine

## 2011-04-21 DIAGNOSIS — J020 Streptococcal pharyngitis: Secondary | ICD-10-CM

## 2011-04-21 LAB — POCT RAPID STREP A (OFFICE): Rapid Strep A Screen: POSITIVE — AB

## 2011-04-21 MED ORDER — PREDNISONE 10 MG PO TABS: ORAL_TABLET | ORAL | Status: DC

## 2011-04-21 MED ORDER — AMOXICILLIN 500 MG PO CAPS: 500.0000 mg | ORAL_CAPSULE | Freq: Three times a day (TID) | ORAL | Status: AC

## 2011-04-21 NOTE — ED Provider Notes (Addendum)
History     CSN: [id number]  Arrival date & time 04/21/11  1647   First MD Initiated Contact with Patient 04/21/11 1826      Chief Complaint  Patient presents with  . Sore Throat    3 days     HPI Comments: Patient complains of sore throat for 3 days, gradually becoming worse.  Today she has had nausea without vomiting.  She has felt fatigued with chills.  No other respiratory symptoms.  No chest pain.  No joint pain.  No rash.  The history is provided by the patient.    Past Medical History  Diagnosis Date  . Thyroid disease   . Depression     Past Surgical History  Procedure Date  . Appendectomy   . Cesarean   . Hernia repair     Family History  Problem Relation Age of Onset  . Diabetes Mother   . Cancer Father     Lung    History  Substance Use Topics  . Smoking status: Current Everyday Smoker -- 0.5 packs/day for 20 years  . Smokeless tobacco: Never Used  . Alcohol Use: 0.6 oz/week    1 Shots of liquor per week    OB History    Grav Para Term Preterm Abortions TAB SAB Ect Mult Living                  Review of Systems + sore throat No cough No pleuritic pain No wheezing No nasal congestion No post-nasal drainage No sinus pain/pressure No itchy/red eyes No earache No hemoptysis No SOB + fever, + chills + nausea No vomiting No abdominal pain No diarrhea No urinary symptoms No skin rashes + fatigue No myalgias + headache   Allergies  Amoxicillin; Penicillins; and Sulfonamide derivatives  Home Medications   Current Outpatient Rx  Name Route Sig Dispense Refill  . ALBUTEROL SULFATE HFA 108 (90 BASE) MCG/ACT IN AERS Inhalation Inhale 2 puffs into the lungs every 4 (four) hours as needed for wheezing. 1 Inhaler 0  . LEVONORGESTREL 20 MCG/24HR IU IUD Intrauterine 1 each by Intrauterine route once.      Marland Kitchen. LEVOTHYROXINE SODIUM 200 MCG PO TABS Oral Take 275 mcg by mouth daily.      Marland Kitchen. PAROXETINE HCL 40 MG PO TABS Oral Take 40 mg by mouth  every morning.      Marland Kitchen. AMOXICILLIN 500 MG PO CAPS Oral Take 1 capsule (500 mg total) by mouth 3 (three) times daily. Take for 10 days 30 capsule 0  . PREDNISONE 10 MG PO TABS  Take 2 tabs by mouth today, then 2 tabs twice daily for two days, then one tab twice daily for 2 days, then 1 daily for two days.  Take PC 16 tablet 0    BP 147/81  Pulse 70  Temp(Src) 98.7 F (37.1 C) (Oral)  Resp 16  Ht 5\' 7"  (1.702 m)  Wt 272 lb (123.378 kg)  BMI 42.60 kg/m2  SpO2 96%  Physical Exam Nursing notes and Vital Signs reviewed. Appearance:  Patient appears obese, stated age, and in no acute distress Eyes:  Pupils are equal, round, and reactive to light and accomodation.  Extraocular movement is intact.  Conjunctivae are not inflamed  Ears:  Canals normal.  Tympanic membranes normal.  Nose:  Mildly congested turbinates.  No sinus tenderness.   Pharynx:  Erythematous and mildly swollen uvula and tonsils.  No exudate Neck:  Supple.   Tender shotty posterior nodes  are palpated bilaterally  Lungs:  Clear to auscultation.  Breath sounds are equal.  Heart:  Regular rate and rhythm without murmurs, rubs, or gallops.  Skin:  No rash present.   ED Course  Procedures  none   Labs Reviewed  POCT RAPID STREP A (OFFICE) - Abnormal; Notable for the following:   Positive      1. Streptococcal sore throat       MDM  Begin amoxicillin for 10 days.  Patient reports that she has taken amoxicillin numerous times in the past without adverse effects.  Begin tapering course of prednisone. Try warm salt water gargles. Gave patient an information sheet about strep pharyngitis and rheumatic fever.  Followup with ENT if not improving.        Donna Christen, MD 04/21/11 1859  Donna Christen, MD 04/21/11 [phone]

## 2011-04-21 NOTE — ED Notes (Signed)
Patient complains of a sore throat x 3 days. She has also felt tired, had some shortness of breath, fever, chills, hoarseness, headaches and nausea.  She has not tried any otc medications, but did try honey and Tabasco last night for her throat.

## 2011-04-21 NOTE — Discharge Instructions (Signed)
Try warm salt water gargles  Salt Water Gargle This solution will help make your mouth and throat feel better. HOME CARE INSTRUCTIONS   Mix 1 teaspoon of salt in 8 ounces of warm water.   Gargle with this solution as much or often as you need or as directed. Swish and gargle gently if you have any sores or wounds in your mouth.   Do not swallow this mixture.  Document Released: 11/15/2003 Document Revised: 10/23/2010 Document Reviewed: 04/07/2008 [facility] Patient Information 2012 [place], [place].

## 2011-11-16 ENCOUNTER — Emergency Department
Admission: EM | Admit: 2011-11-16 | Discharge: 2011-11-16 | Disposition: A | Discharge status: Home / Self Care | Payer: Self-pay | Source: Home / Self Care | Attending: Family Medicine | Admitting: Family Medicine

## 2011-11-16 ENCOUNTER — Encounter: Payer: Self-pay | Admitting: *Deleted

## 2011-11-16 DIAGNOSIS — M940 Chondrocostal junction syndrome [Tietze]: Secondary | ICD-10-CM

## 2011-11-16 DIAGNOSIS — J069 Acute upper respiratory infection, unspecified: Secondary | ICD-10-CM

## 2011-11-16 MED ORDER — BENZONATATE 200 MG PO CAPS: 200.0000 mg | ORAL_CAPSULE | Freq: Every day | ORAL | Status: DC

## 2011-11-16 MED ORDER — PREDNISONE 20 MG PO TABS: 20.0000 mg | ORAL_TABLET | Freq: Two times a day (BID) | ORAL | Status: DC

## 2011-11-16 MED ORDER — AZITHROMYCIN 250 MG PO TABS: ORAL_TABLET | ORAL | Status: DC

## 2011-11-16 NOTE — Discharge Instructions (Signed)
Take plain Mucinex (guaifenesin) twice daily for cough and congestion.  May add Sudafed as needed.  Increase fluid intake, rest. May use Afrin nasal spray (or generic oxymetazoline) twice daily for about 5 days.  Also recommend using saline nasal spray several times daily and saline nasal irrigation (AYR is a common brand) Stop all antihistamines for now, and other non-prescription cough/cold preparations. May take Ibuprofen 200mg , 4 tabs every 8 hours with food for chest/sternum discomfort. Follow-up with family doctor if not improving 7 to 10 days.

## 2011-11-16 NOTE — ED Notes (Signed)
Pt has had  Cough head and chest congestion lasting 1 wk.  Cough is productive and nasal drainage is yellow

## 2011-11-16 NOTE — ED Provider Notes (Signed)
History     CSN: [id number]  Arrival date & time 11/16/11  1248   First MD Initiated Contact with Patient 11/16/11 1315      Chief Complaint  Patient presents with  . Cough  . Nasal Congestion     HPI Comments: Patient complains of approximately 6 day history of gradually progressive URI symptoms beginning with a mild sore throat (now improved), followed by progressive nasal congestion.  A cough started about 4 days ago.  Complains of fatigue and initial myalgias.  Cough is now worse at night and generally non-productive during the day.  There has been no pleuritic pain or shortness of breath but she does wheeze at times.  She had asthma as a child.  She continues to smoke.  The history is provided by the patient.    Past Medical History  Diagnosis Date  . Thyroid disease   . Depression     Past Surgical History  Procedure Date  . Appendectomy   . Cesarean   . Hernia repair     Family History  Problem Relation Age of Onset  . Diabetes Mother   . Cancer Father     Lung    History  Substance Use Topics  . Smoking status: Current Every Day Smoker -- 0.5 packs/day for 20 years    Types: Cigarettes  . Smokeless tobacco: Never Used  . Alcohol Use: 0.6 oz/week    1 Shots of liquor per week    OB History    Grav Para Term Preterm Abortions TAB SAB Ect Mult Living                  Review of Systems + sore throat, improved + cough No pleuritic pain + wheezing + nasal congestion + post-nasal drainage No sinus pain/pressure No itchy/red eyes No earache No hemoptysis No SOB No fever/chills No nausea No vomiting No abdominal pain No diarrhea No urinary symptoms No skin rashes + fatigue + myalgias No headache Used OTC meds without relief  Allergies  Amoxicillin; Penicillins; and Sulfonamide derivatives  Home Medications   Current Outpatient Rx  Name Route Sig Dispense Refill  . ALBUTEROL SULFATE HFA 108 (90 BASE) MCG/ACT IN AERS Inhalation Inhale 2  puffs into the lungs every 4 (four) hours as needed for wheezing. 1 Inhaler 0  . AZITHROMYCIN 250 MG PO TABS  Take 2 tabs today; then begin one tab once daily for 4 more days. 6 each 0  . BENZONATATE 200 MG PO CAPS Oral Take 1 capsule (200 mg total) by mouth at bedtime. Take as needed for cough 12 capsule 0  . LEVONORGESTREL 20 MCG/24HR IU IUD Intrauterine 1 each by Intrauterine route once.      Marland Kitchen. LEVOTHYROXINE SODIUM 200 MCG PO TABS Oral Take 275 mcg by mouth daily.      Marland Kitchen. PAROXETINE HCL 40 MG PO TABS Oral Take 40 mg by mouth every morning.      Marland Kitchen. PREDNISONE 10 MG PO TABS  Take 2 tabs by mouth today, then 2 tabs twice daily for two days, then one tab twice daily for 2 days, then 1 daily for two days.  Take PC 16 tablet 0  . PREDNISONE 20 MG PO TABS Oral Take 1 tablet (20 mg total) by mouth 2 (two) times daily. Take with food. 10 tablet 0    BP 139/83  Pulse 75  Temp 97.9 F (36.6 C) (Oral)  Resp 14  Ht 5\' 7"  (1.702 m)  Wt 271 lb 4 oz (123.038 kg)  BMI 42.48 kg/m2  SpO2 97%  Physical Exam Nursing notes and Vital Signs reviewed. Appearance:  Patient appears stated age, and in no acute distress.  Patient is obese (BMI 42.5) Eyes:  Pupils are equal, round, and reactive to light and accomodation.  Extraocular movement is intact.  Conjunctivae are not inflamed  Ears:  Canals normal.  Tympanic membranes normal.  Nose:  Mildly congested turbinates.  No sinus tenderness.  Pharynx:  Normal Neck:  Supple.  Slightly tender shotty posterior nodes are palpated bilaterally  Lungs:  Clear to auscultation.  Breath sounds are equal.  Chest:  Distinct tenderness to palpation over the mid-sternum.  Heart:  Regular rate and rhythm without murmurs, rubs, or gallops.  Abdomen:  Nontender without masses or hepatosplenomegaly.  Bowel sounds are present.  No CVA or flank tenderness.  Extremities:  No edema.  No calf tenderness Skin:  No rash present.   ED Course  Procedures  none      1. Acute upper  respiratory infections of unspecified site; ? Bronchitis in a smoker  2. Costochondritis, acute       MDM  Begin Z-pack, prednisone burst, and tessalon at bedtime. Take plain Mucinex (guaifenesin) twice daily for cough and congestion.  May add Sudafed as needed.  Increase fluid intake, rest. May use Afrin nasal spray (or generic oxymetazoline) twice daily for about 5 days.  Also recommend using saline nasal spray several times daily and saline nasal irrigation (AYR is a common brand) Stop all antihistamines for now, and other non-prescription cough/cold preparations. May take Ibuprofen , 4 tabs every 8 hours with food for chest/sternum discomfort. Follow-up with family doctor if not improving 7 to 10 days.        Lattie Haw, MD 11/20/11 [phone]

## 2011-11-20 ENCOUNTER — Telehealth: Payer: Self-pay | Admitting: Family Medicine

## 2013-01-06 ENCOUNTER — Encounter: Payer: Self-pay | Admitting: Emergency Medicine

## 2013-01-06 ENCOUNTER — Emergency Department
Admission: EM | Admit: 2013-01-06 | Discharge: 2013-01-06 | Disposition: A | Discharge status: Home / Self Care | Payer: Self-pay | Source: Home / Self Care | Attending: Emergency Medicine | Admitting: Emergency Medicine

## 2013-01-06 DIAGNOSIS — J209 Acute bronchitis, unspecified: Secondary | ICD-10-CM

## 2013-01-06 MED ORDER — AZITHROMYCIN 250 MG PO TABS: ORAL_TABLET | ORAL | Status: DC

## 2013-01-06 MED ORDER — PREDNISONE (PAK) 10 MG PO TABS: ORAL_TABLET | ORAL | Status: DC

## 2013-01-06 NOTE — ED Notes (Signed)
Dry cough x 5 days, congestion, wheezing, sneezing

## 2013-01-06 NOTE — ED Provider Notes (Signed)
CSN: [id number]     Arrival date & time 01/06/13  1717 History   First MD Initiated Contact with Patient 01/06/13 1758     Chief Complaint  Patient presents with  . Cough   (Consider location/radiation/quality/duration/timing/severity/associated sxs/prior Treatment) Patient is a 37 y.o. female presenting with cough. The history is provided by the patient.  Cough Cough characteristics:  Productive Sputum characteristics:  Yellow Severity:  Moderate Onset quality:  Gradual Duration:  5 days Progression:  Worsening Chronicity:  Recurrent Smoker: yes   Context: sick contacts and weather changes   Context: not occupational exposure   Relieved by:  Nothing Worsened by:  Exposure to cold air Ineffective treatments:  Decongestant and cough suppressants Associated symptoms: fever (Low-grade) and wheezing   Associated symptoms: no chest pain, no chills, no ear pain, no eye discharge, no rash, no rhinorrhea and no shortness of breath   Risk factors: no recent travel     Past Medical History  Diagnosis Date  . Thyroid disease   . Depression    Past Surgical History  Procedure Laterality Date  . Appendectomy    . Cesarean    . Hernia repair     Family History  Problem Relation Age of Onset  . Diabetes Mother   . Cancer Father     Lung   History  Substance Use Topics  . Smoking status: Current Every Day Smoker -- 0.50 packs/day for 20 years    Types: Cigarettes  . Smokeless tobacco: Never Used  . Alcohol Use: 0.6 oz/week    1 Shots of liquor per week   OB History   Grav Para Term Preterm Abortions TAB SAB Ect Mult Living                 Review of Systems  Constitutional: Positive for fever (Low-grade). Negative for chills.  HENT: Negative for ear pain and rhinorrhea.   Eyes: Negative for discharge.  Respiratory: Positive for cough and wheezing. Negative for shortness of breath.   Cardiovascular: Negative for chest pain.  Skin: Negative for rash.  All other systems  reviewed and are negative.    Allergies  Amoxicillin; Penicillins; and Sulfonamide derivatives  Home Medications   Current Outpatient Rx  Name  Route  Sig  Dispense  Refill  . EXPIRED: albuterol (PROVENTIL HFA;VENTOLIN HFA) 108 (90 BASE) MCG/ACT inhaler   Inhalation   Inhale 2 puffs into the lungs every 4 (four) hours as needed for wheezing.   1 Inhaler   0   . azithromycin (ZITHROMAX Z-PAK) 250 MG tablet      Take 2 tabs today; then begin one tab once daily for 4 more days.   6 each   0   . azithromycin (ZITHROMAX Z-PAK) 250 MG tablet      Take 2 tablets on day one, then 1 tablet daily on days 2 through 5   1 each   0   . benzonatate (TESSALON) 200 MG capsule   Oral   Take 1 capsule (200 mg total) by mouth at bedtime. Take as needed for cough   12 capsule   0   . levonorgestrel (MIRENA) 20 MCG/24HR IUD   Intrauterine   1 each by Intrauterine route once.           Marland Kitchen levothyroxine (SYNTHROID, LEVOTHROID) 200 MCG tablet   Oral   Take 275 mcg by mouth daily.           Marland Kitchen PARoxetine (PAXIL) 40 MG tablet  Oral   Take 40 mg by mouth every morning.           . predniSONE (DELTASONE) 10 MG tablet      Take 2 tabs by mouth today, then 2 tabs twice daily for two days, then one tab twice daily for 2 days, then 1 daily for two days.  Take PC   16 tablet   0   . predniSONE (DELTASONE) 20 MG tablet   Oral   Take 1 tablet (20 mg total) by mouth 2 (two) times daily. Take with food.   10 tablet   0   . predniSONE (STERAPRED UNI-PAK) 10 MG tablet      Take as directed for 6 days.--Take 6 on day 1, 5 on day 2, 4 on day 3, then 3 tablets on day 4, then 2 tablets on day 5, then 1 on day 6.   21 tablet   0    BP 146/86  Pulse 73  Temp(Src) 98.1 F (36.7 C) (Oral)  Ht 5\' 8"  (1.727 m)  Wt 272 lb (123.378 kg)  BMI 41.37 kg/m2  SpO2 97% Physical Exam  Nursing note and vitals reviewed. Constitutional: She is oriented to person, place, and time. She appears  well-developed and well-nourished. No distress.  HENT:  Head: Normocephalic and atraumatic.  Right Ear: Tympanic membrane normal.  Left Ear: Tympanic membrane normal.  Nose: Nose normal.  Mouth/Throat: Oropharynx is clear and moist. No oropharyngeal exudate.  Eyes: Right eye exhibits no discharge. Left eye exhibits no discharge. No scleral icterus.  Neck: Neck supple.  Cardiovascular: Normal rate, regular rhythm and normal heart sounds.   Pulmonary/Chest: No respiratory distress. She has wheezes (Mild late expiratory wheezes throughout). She has rhonchi. She has no rales.  Lymphadenopathy:    She has no cervical adenopathy.  Neurological: She is alert and oriented to person, place, and time.  Skin: Skin is warm and dry.    ED Course  Procedures (including critical care time) Labs Review Labs Reviewed - No data to display Imaging Review No results found.  EKG Interpretation     Ventricular Rate:    PR Interval:    QRS Duration:   QT Interval:    QTC Calculation:   R Axis:     Text Interpretation:              MDM   1. BRONCHITIS, ACUTE    Treatment options discussed. Use of rescue inhaler that she has at home and uses about once every 6 months. Advised to quit smoking Prednisone 10 mg-6 day Dosepak.--She states this is always worked when she's had similar symptoms in the past Z-Pak. Other symptomatic care. Followup with PCP if no better one week, sooner if worse or new symptoms Precautions discussed. Red flags discussed. Questions invited and answered. Patient voiced understanding and agreement.    Lajean Manes, MD 01/06/13 2002

## 2013-01-14 ENCOUNTER — Emergency Department
Admission: EM | Admit: 2013-01-14 | Discharge: 2013-01-14 | Disposition: A | Discharge status: Home / Self Care | Payer: Self-pay | Source: Home / Self Care | Attending: Emergency Medicine | Admitting: Emergency Medicine

## 2013-01-14 ENCOUNTER — Emergency Department (INDEPENDENT_AMBULATORY_CARE_PROVIDER_SITE_OTHER): Payer: Self-pay

## 2013-01-14 ENCOUNTER — Encounter: Payer: Self-pay | Admitting: Emergency Medicine

## 2013-01-14 DIAGNOSIS — B001 Herpesviral vesicular dermatitis: Secondary | ICD-10-CM

## 2013-01-14 DIAGNOSIS — J4541 Moderate persistent asthma with (acute) exacerbation: Secondary | ICD-10-CM

## 2013-01-14 DIAGNOSIS — R059 Cough, unspecified: Secondary | ICD-10-CM

## 2013-01-14 DIAGNOSIS — J45901 Unspecified asthma with (acute) exacerbation: Secondary | ICD-10-CM

## 2013-01-14 DIAGNOSIS — R05 Cough: Secondary | ICD-10-CM

## 2013-01-14 MED ORDER — PROMETHAZINE-CODEINE 6.25-10 MG/5ML PO SYRP: ORAL_SOLUTION | ORAL | Status: DC

## 2013-01-14 MED ORDER — METHYLPREDNISOLONE ACETATE 80 MG/ML IJ SUSP
80.0000 mg | Freq: Once | INTRAMUSCULAR | Status: AC
  Administered 2013-01-14: 80 mg via INTRAMUSCULAR

## 2013-01-14 MED ORDER — VALACYCLOVIR HCL 1 G PO TABS: 2000.0000 mg | ORAL_TABLET | Freq: Two times a day (BID) | ORAL | Status: DC

## 2013-01-14 MED ORDER — IPRATROPIUM-ALBUTEROL 0.5-2.5 (3) MG/3ML IN SOLN
3.0000 mL | RESPIRATORY_TRACT | Status: AC
  Administered 2013-01-14: 3 mL via RESPIRATORY_TRACT

## 2013-01-14 MED ORDER — LEVOFLOXACIN 500 MG PO TABS: ORAL_TABLET | ORAL | Status: DC

## 2013-01-14 MED ORDER — ALBUTEROL SULFATE HFA 108 (90 BASE) MCG/ACT IN AERS: 2.0000 | INHALATION_SPRAY | RESPIRATORY_TRACT | Status: DC | PRN

## 2013-01-14 MED ORDER — PREDNISONE (PAK) 10 MG PO TABS: ORAL_TABLET | ORAL | Status: DC

## 2013-01-14 MED ORDER — BENZONATATE 100 MG PO CAPS: 100.0000 mg | ORAL_CAPSULE | Freq: Three times a day (TID) | ORAL | Status: DC

## 2013-01-14 NOTE — ED Notes (Signed)
Tracy Carr was seen and treated here 1 week ago for cough and congestion. Treated with z-pack and prednisone. She has been taking as prescribed. Denies fever. Cough is worse, only productive after lying down.

## 2013-01-14 NOTE — ED Provider Notes (Addendum)
CSN: [id number]     Arrival date & time 01/14/13  1723 History   First MD Initiated Contact with Patient 01/14/13 1740     Chief Complaint  Patient presents with  . URI   (Consider location/radiation/quality/duration/timing/severity/associated sxs/prior Treatment) HPI URI HISTORY  Thora is a 37 y.o. female who complains of onset of chest congestion, cough productive of thick sputum, for 2 weeks. Haja was seen and treated here 1 week ago for cough and congestion. Treated with z-pack and prednisone. She has been taking as prescribed. Denies fever. Cough is worse, only productive after lying down.  Also, had a flareup of fever blisters/cold sores upper lips, and she requests prescription for Valtrex for this.  .No chills/sweats No  Fever  +  Nasal congestion No Discolored Post-nasal drainage No sinus pain/pressure No sore throat  +  Hacking cough, occasionally productive of thick discolored sputum.--Cough keeps her up at night + wheezing + chest congestion No hemoptysis Minimal episodes of shortness of breath when she is wheezing. No pleuritic pain  No itchy/red eyes No earache  No nausea No vomiting No abdominal pain No diarrhea  No skin rashes +  Fatigue No myalgias No headache  Past Medical History  Diagnosis Date  . Thyroid disease   . Depression    Past Surgical History  Procedure Laterality Date  . Appendectomy    . Cesarean    . Hernia repair     Family History  Problem Relation Age of Onset  . Diabetes Mother   . Cancer Father     Lung   History  Substance Use Topics  . Smoking status: Current Every Day Smoker -- 0.50 packs/day for 20 years    Types: Cigarettes  . Smokeless tobacco: Never Used  . Alcohol Use: 0.6 oz/week    1 Shots of liquor per week   OB History   Grav Para Term Preterm Abortions TAB SAB Ect Mult Living                 Review of Systems  All other systems reviewed and are negative.    Allergies  Amoxicillin;  Penicillins; and Sulfonamide derivatives  Home Medications   Current Outpatient Rx  Name  Route  Sig  Dispense  Refill  . albuterol (PROVENTIL HFA;VENTOLIN HFA) 108 (90 BASE) MCG/ACT inhaler   Inhalation   Inhale 2 puffs into the lungs every 4 (four) hours as needed for wheezing.   18 g   1   . benzonatate (TESSALON) 100 MG capsule   Oral   Take 1 capsule (100 mg total) by mouth every 8 (eight) hours. As needed for cough   21 capsule   0   . levofloxacin (LEVAQUIN) 500 MG tablet      Take 1 tablet daily for 10 days.   10 tablet   0   . levonorgestrel (MIRENA) 20 MCG/24HR IUD   Intrauterine   1 each by Intrauterine route once.           Marland Kitchen levothyroxine (SYNTHROID, LEVOTHROID) 200 MCG tablet   Oral   Take 275 mcg by mouth daily.           Marland Kitchen PARoxetine (PAXIL) 40 MG tablet   Oral   Take 40 mg by mouth every morning.           . predniSONE (STERAPRED UNI-PAK) 10 MG tablet      Take as directed for 6 days.--Take 6 on day 1, 5 on day 2,  4 on day 3, then 3 tablets on day 4, then 2 tablets on day 5, then 1 on day 6.   21 tablet   0   . promethazine-codeine (PHENERGAN WITH CODEINE) 6.25-10 MG/5ML syrup      Take 1-2 teaspoons every 4-6 hours as needed for cough. May cause drowsiness.   120 mL   0   . valACYclovir (VALTREX) 1000 MG tablet   Oral   Take 2 tablets (2,000 mg total) by mouth 2 (two) times daily. For fever blisters/cold sores   12 tablet   1    BP 131/79  Pulse 71  Temp(Src) 99.1 F (37.3 C) (Oral)  Resp 14  SpO2 98% Physical Exam  Nursing note and vitals reviewed. Constitutional: She is oriented to person, place, and time. She appears well-developed and well-nourished. No distress.  HENT:  Head: Normocephalic and atraumatic.  Right Ear: Tympanic membrane normal.  Left Ear: Tympanic membrane normal.  Nose: Nose normal.  Mouth/Throat: Oropharynx is clear and moist. No oropharyngeal exudate.  There are vesicles/cold sores bilateral upper  lip. No pustules  Eyes: Right eye exhibits no discharge. Left eye exhibits no discharge. No scleral icterus.  Neck: Neck supple.  Cardiovascular: Normal rate, regular rhythm and normal heart sounds.   Pulmonary/Chest: No respiratory distress. She has wheezes. She has rhonchi. She has no rales.  Lymphadenopathy:    She has no cervical adenopathy.  Neurological: She is alert and oriented to person, place, and time.  Skin: Skin is warm and dry.   no adenopathy  ED Course  Procedures (including critical care time) Labs Review Labs Reviewed - No data to display Imaging Review Dg Chest 2 View  01/14/2013   CLINICAL DATA:  Upper respiratory infection for 12 days, cough  EXAM: CHEST  2 VIEW  COMPARISON:  02/06/2011  FINDINGS: The heart size and mediastinal contours are within normal limits. Both lungs are clear. The visualized skeletal structures are unremarkable.  IMPRESSION: No active cardiopulmonary disease.   Electronically Signed   By: Esperanza Heir M.D.   On: 01/14/2013 18:28    EKG Interpretation    Date/Time:    Ventricular Rate:    PR Interval:    QRS Duration:   QT Interval:    QTC Calculation:   R Axis:     Text Interpretation:              MDM   1. Acute asthmatic bronchitis, moderate persistent   2. Recurrent cold sores    Reviewed chest x-ray showing no infiltrates. No active cardiopulmonary disease. DuoNeb nebulizer treatment given. Patient reevaluated and wheezing improved, still had diffuse rhonchi. Treatment options discussed. After risks, benefits, alternatives discussed, she agrees with the following plans: Levaquin 500 mg daily x10 days Prednisone 10 mg-6 day Dosepak Depo-Medrol, 80 mg IM stat Refill albuterol HFA for rescue inhaler when necessary wheezing Promethazine with codeine cough syrup when necessary severe cough. Precautions discussed Tessalon Perles when necessary moderate cough Also, for cold sores, Valtrex prescribed with instructions  and precautions. Advised to quit smoking If no better in one week, advised to see a pulmonologist  Precautions discussed. Red flags discussed. Questions invited and answered. Patient voiced understanding and agreement.     Lajean Manes, MD 01/14/13 1610  Lajean Manes, MD 01/14/13 [phone]

## 2013-06-05 ENCOUNTER — Encounter: Payer: Self-pay | Admitting: Emergency Medicine

## 2013-06-05 ENCOUNTER — Emergency Department
Admission: EM | Admit: 2013-06-05 | Discharge: 2013-06-05 | Disposition: A | Discharge status: Home / Self Care | Payer: BC Managed Care – PPO | Source: Home / Self Care

## 2013-06-05 DIAGNOSIS — R309 Painful micturition, unspecified: Secondary | ICD-10-CM

## 2013-06-05 DIAGNOSIS — N390 Urinary tract infection, site not specified: Secondary | ICD-10-CM

## 2013-06-05 DIAGNOSIS — J209 Acute bronchitis, unspecified: Secondary | ICD-10-CM

## 2013-06-05 DIAGNOSIS — R300 Dysuria: Secondary | ICD-10-CM

## 2013-06-05 LAB — POCT URINALYSIS DIP (MANUAL ENTRY)
Bilirubin, UA: NEGATIVE
Glucose, UA: NEGATIVE
Ketones, POC UA: NEGATIVE
Nitrite, UA: NEGATIVE
Protein Ur, POC: NEGATIVE
Spec Grav, UA: 1.025 (ref 1.005–1.03)
Urobilinogen, UA: 0.2 (ref 0–1)
pH, UA: 6 (ref 5–8)

## 2013-06-05 MED ORDER — FLUTICASONE PROPIONATE 50 MCG/ACT NA SUSP: NASAL | Status: DC

## 2013-06-05 MED ORDER — PREDNISONE (PAK) 10 MG PO TABS: ORAL_TABLET | ORAL | Status: DC

## 2013-06-05 MED ORDER — IPRATROPIUM-ALBUTEROL 0.5-2.5 (3) MG/3ML IN SOLN
3.0000 mL | Freq: Once | RESPIRATORY_TRACT | Status: AC
Start: 2013-06-05 — End: 2013-06-05
  Administered 2013-06-05: 3 mL via RESPIRATORY_TRACT

## 2013-06-05 MED ORDER — LEVOFLOXACIN 500 MG PO TABS: ORAL_TABLET | ORAL | Status: DC

## 2013-06-05 MED ORDER — METHYLPREDNISOLONE SODIUM SUCC 125 MG IJ SOLR
125.0000 mg | INTRAMUSCULAR | Status: AC
  Administered 2013-06-05: 125 mg via INTRAMUSCULAR

## 2013-06-05 NOTE — ED Notes (Signed)
Tracy Carr complains of sore throat, pressure in ear and runny nose for 5 days. She started taking Allegra. She also reports sneezing, congestion, wheezing, shortness of breath and cough for 3 days.   She has also had painful urination and stomach pain for 4 days.

## 2013-06-05 NOTE — ED Provider Notes (Signed)
CSN: [id number]     Arrival date & time 06/05/13  1251 History   None    Chief Complaint  Patient presents with  . Sore Throat  . Cough  . Wheezing  . Dysuria    HPI Tracy Carr complains of sore throat, pressure in ear and runny nose for 5 days. She started taking Allegra. She also reports sneezing, congestion, wheezing, shortness of breath and cough for 3 days.  She has also had painful urination and mild lower abd pain for 4 days.   URI HISTORY  Tracy Carr is a 38 y.o. female who complains of onset of 5 days of sinus congestion progressive to chest congestion and wheezing and cough, minimally productive of discolored sputum.  Have been using over-the-counter treatment which helps a little bit. She continues to smoke cigarettes. Last time she had similar symptoms was November, Z-Pak was not successful, but she improved at a followup visit in November when treated with duo neb, prednisone, and Levaquin   No chills/sweats No definite Fever  +  Nasal congestion +  Discolored Post-nasal drainage Mild sinus pain/pressure Minimal sore throat  +  cough Positive wheezing Positive chest congestion No hemoptysis No shortness of breath No pleuritic pain  No itchy/red eyes No earache  No nausea No vomiting No abdominal pain No diarrhea  No skin rashes +  Fatigue No myalgias No headache    UTI symptoms for 4 days.  + dysuria + frequency + urgency No hematuria No vaginal discharge No fever/chills Mild lower abdominal pain No nausea No vomiting No back pain + fatigue She denies chance of pregnancy. Has tried over-the-counter measures without improvement.     Past Medical History  Diagnosis Date  . Thyroid disease   . Depression    Past Surgical History  Procedure Laterality Date  . Appendectomy    . Cesarean    . Hernia repair     Family History  Problem Relation Age of Onset  . Diabetes Mother   . Cancer Father     Lung   History  Substance Use Topics   . Smoking status: Current Every Day Smoker -- 0.50 packs/day for 20 years    Types: Cigarettes  . Smokeless tobacco: Never Used  . Alcohol Use: 0.6 oz/week    1 Shots of liquor per week   OB History   Grav Para Term Preterm Abortions TAB SAB Ect Mult Living                 Review of Systems  All other systems reviewed and are negative.   Allergies  Amoxicillin; Penicillins; and Sulfonamide derivatives  Home Medications   Current Outpatient Rx  Name  Route  Sig  Dispense  Refill  . levonorgestrel (MIRENA) 20 MCG/24HR IUD   Intrauterine   1 each by Intrauterine route once.           Marland Kitchen levothyroxine (SYNTHROID, LEVOTHROID) 200 MCG tablet   Oral   Take 275 mcg by mouth daily.           Marland Kitchen PARoxetine (PAXIL) 40 MG tablet   Oral   Take 40 mg by mouth every morning.           Marland Kitchen albuterol (PROVENTIL HFA;VENTOLIN HFA) 108 (90 BASE) MCG/ACT inhaler   Inhalation   Inhale 2 puffs into the lungs every 4 (four) hours as needed for wheezing.   18 g   1   . benzonatate (TESSALON) 100 MG capsule   Oral  Take 1 capsule (100 mg total) by mouth every 8 (eight) hours. As needed for cough   21 capsule   0   . fluticasone (FLONASE) 50 MCG/ACT nasal spray      1 or 2 sprays each nostril twice a day   16 g   0   . levofloxacin (LEVAQUIN) 500 MG tablet      Take 1 tablet daily X 10 days.   10 tablet   0   . predniSONE (STERAPRED UNI-PAK) 10 MG tablet      Take as directed for 6 days.--Take 6 on day 1, 5 on day 2, 4 on day 3, then 3 tablets on day 4, then 2 tablets on day 5, then 1 on day 6.   21 tablet   0   . promethazine-codeine (PHENERGAN WITH CODEINE) 6.25-10 MG/5ML syrup      Take 1-2 teaspoons every 4-6 hours as needed for cough. May cause drowsiness.   120 mL   0   . valACYclovir (VALTREX) 1000 MG tablet   Oral   Take 2 tablets (2,000 mg total) by mouth 2 (two) times daily. For fever blisters/cold sores   12 tablet   1    BP 140/91  Pulse 80   Temp(Src) 98.1 F (36.7 C) (Oral)  Resp 16  Ht 5\' 7"  (1.702 m)  Wt 269 lb (122.018 kg)  BMI 42.12 kg/m2  SpO2 98% Physical Exam  Nursing note and vitals reviewed. Constitutional: She is oriented to person, place, and time. She appears well-developed and well-nourished. No distress.  HENT:  Head: Normocephalic and atraumatic.  Right Ear: Tympanic membrane normal.  Left Ear: Tympanic membrane normal.  Nose: Nose normal.  Mouth/Throat: Oropharynx is clear and moist. No oropharyngeal exudate.  Eyes: Right eye exhibits no discharge. Left eye exhibits no discharge. No scleral icterus.  Neck: Neck supple.  Cardiovascular: Normal rate, regular rhythm and normal heart sounds.   Pulmonary/Chest: No respiratory distress. She has wheezes. She has rhonchi. She has no rales.  Abdominal:  Minimal suprapubic tenderness. No CVA tenderness  Lymphadenopathy:    She has no cervical adenopathy.  Neurological: She is alert and oriented to person, place, and time. No cranial nerve deficit.  Skin: Skin is warm and dry. No rash noted.    ED Course  Procedures (including critical care time) Labs Review Labs Reviewed  POCT URINALYSIS DIP (MANUAL ENTRY) - Abnormal; Notable for the following:   URINE CULTURE   Imaging Review No results found.  Urinalysis positive for blood and leukocytes MDM   1. Bronchitis with bronchospasm   2. Painful urination   3. UTI (urinary tract infection)     DuoNeb nebulizer treatment given. Wheezing improved. Treatment options discussed, as well as risks, benefits, alternatives. Patient voiced understanding and agreement with the following plans: Levaquin 500 mg daily x10 day. This worked best for her last bout of bronchitis in November. The Levaquin is for bacterial coverage for bronchitis and also for her UTI. Flonase Prednisone 6 day Dosepak Solu-Medrol 125 mg IM stat Advised to quit smoking and explained risks of not doing so. Follow-up with your primary care  doctor in 5-7 days if not improving, or sooner if symptoms become worse. Precautions discussed. Red flags discussed. Questions invited and answered. Patient voiced understanding and agreement.    Lajean Manesavid Massey, MD 06/05/13 [id number]

## 2013-06-07 LAB — URINE CULTURE: Colony Count: 15000

## 2013-06-14 ENCOUNTER — Telehealth: Payer: Self-pay | Admitting: *Deleted

## 2014-02-21 ENCOUNTER — Encounter: Payer: Self-pay | Admitting: Emergency Medicine

## 2014-02-21 ENCOUNTER — Emergency Department
Admission: EM | Admit: 2014-02-21 | Discharge: 2014-02-21 | Disposition: A | Discharge status: Home / Self Care | Payer: Self-pay | Source: Home / Self Care | Attending: Emergency Medicine | Admitting: Emergency Medicine

## 2014-02-21 DIAGNOSIS — J208 Acute bronchitis due to other specified organisms: Secondary | ICD-10-CM

## 2014-02-21 DIAGNOSIS — J209 Acute bronchitis, unspecified: Secondary | ICD-10-CM

## 2014-02-21 MED ORDER — PREDNISONE (PAK) 10 MG PO TABS: ORAL_TABLET | ORAL | Status: DC

## 2014-02-21 MED ORDER — ALBUTEROL SULFATE HFA 108 (90 BASE) MCG/ACT IN AERS: 2.0000 | INHALATION_SPRAY | RESPIRATORY_TRACT | Status: DC | PRN

## 2014-02-21 MED ORDER — LEVOFLOXACIN 500 MG PO TABS: ORAL_TABLET | ORAL | Status: DC

## 2014-02-21 NOTE — Discharge Instructions (Signed)

## 2014-02-21 NOTE — ED Notes (Signed)
Productive cough x 1 week.

## 2014-02-21 NOTE — ED Provider Notes (Signed)
CSN: [id number]     Arrival date & time 02/21/14  [id number] History   First MD Initiated Contact with Patient 02/21/14 1008     Chief Complaint  Patient presents with  . Cough   (Consider location/radiation/quality/duration/timing/severity/associated sxs/prior Treatment) Patient is a 38 y.o. female presenting with cough. The history is provided by the patient. No language interpreter was used.  Cough Cough characteristics:  Productive Sputum characteristics:  Green Severity:  Moderate Onset quality:  Gradual Duration:  1 week Timing:  Constant Progression:  Worsening Chronicity:  New Smoker: yes   Relieved by:  Nothing Worsened by:  Nothing tried Ineffective treatments:  None tried Associated symptoms: shortness of breath   Associated symptoms: no chest pain and no fever   Risk factors: no recent infection     Past Medical History  Diagnosis Date  . Thyroid disease   . Depression    Past Surgical History  Procedure Laterality Date  . Appendectomy    . Cesarean    . Hernia repair     Family History  Problem Relation Age of Onset  . Diabetes Mother   . Cancer Father     Lung   History  Substance Use Topics  . Smoking status: Current Every Day Smoker -- 1.00 packs/day for 25 years    Types: Cigarettes  . Smokeless tobacco: Never Used  . Alcohol Use: 0.6 oz/week    1 Shots of liquor per week   OB History    No data available     Review of Systems  Constitutional: Negative for fever.  Respiratory: Positive for cough and shortness of breath.   Cardiovascular: Negative for chest pain.  All other systems reviewed and are negative.   Allergies  Amoxicillin; Penicillins; and Sulfonamide derivatives  Home Medications   Prior to Admission medications   Medication Sig Start Date End Date Taking? Authorizing Provider  albuterol (PROVENTIL HFA;VENTOLIN HFA) 108 (90 BASE) MCG/ACT inhaler Inhale 2 puffs into the lungs every 4 (four) hours as needed for wheezing.  02/21/14   Elson AreasLeslie K Jasie Meleski, PA-C  benzonatate (TESSALON) 100 MG capsule Take 1 capsule (100 mg total) by mouth every 8 (eight) hours. As needed for cough 01/14/13   Lajean Manesavid Massey, MD  fluticasone [facility](FLONASE) 50 MCG/ACT nasal spray 1 or 2 sprays each nostril twice a day 06/05/13   Lajean Manesavid Massey, MD  levofloxacin (LEVAQUIN) 500 MG tablet Take 1 tablet daily X 10 days. 02/21/14   Elson AreasLeslie K Weyman Bogdon, PA-C  levonorgestrel (MIRENA) 20 MCG/24HR IUD 1 each by Intrauterine route once.      Historical Provider, MD  levothyroxine (SYNTHROID, LEVOTHROID) 200 MCG tablet Take 275 mcg by mouth daily.      Historical Provider, MD  PARoxetine (PAXIL) 40 MG tablet Take 40 mg by mouth every morning.      Historical Provider, MD  predniSONE (STERAPRED UNI-PAK) 10 MG tablet Take as directed for 6 days.--Take 6 on day 1, 5 on day 2, 4 on day 3, then 3 tablets on day 4, then 2 tablets on day 5, then 1 on day 6. 02/21/14   Elson AreasLeslie K Saya Mccoll, PA-C  promethazine-codeine [facility](PHENERGAN WITH CODEINE) 6.25-10 MG/5ML syrup Take 1-2 teaspoons every 4-6 hours as needed for cough. May cause drowsiness. 01/14/13   Lajean Manesavid Massey, MD  valACYclovir (VALTREX) 1000 MG tablet Take 2 tablets (2,000 mg total) by mouth 2 (two) times daily. For fever blisters/cold sores 01/14/13   Lajean Manesavid Massey, MD   BP 158/76 mmHg  Pulse 77  Temp(Src) 98.6 F (37 C) (Oral)  Ht 5\' 7"  (1.702 m)  Wt 274 lb (124.286 kg)  BMI 42.90 kg/m2  SpO2 98% Physical Exam  Constitutional: She is oriented to person, place, and time. She appears well-developed and well-nourished.  HENT:  Head: Normocephalic and atraumatic.  Right Ear: External ear normal.  Left Ear: External ear normal.  Nose: Nose normal.  Mouth/Throat: Oropharynx is clear and moist.  Eyes: EOM are normal. Pupils are equal, round, and reactive to light.  Neck: Normal range of motion.  Cardiovascular: Normal rate and normal heart sounds.   Pulmonary/Chest: Effort normal.  Abdominal: Soft. She exhibits no  distension.  Musculoskeletal: Normal range of motion.  Neurological: She is alert and oriented to person, place, and time.  Skin: Skin is warm.  Psychiatric: She has a normal mood and affect.  Nursing note and vitals reviewed.   ED Course  Procedures (including critical care time) Labs Review Labs Reviewed - No data to display  Imaging Review No results found.   MDM  Pt counseled on smoking cessation.   I will treat with levaquin, albuterol and prednisone   1. Acute bronchitis due to other specified organisms    Primary care recheck in 1 week if not resolved AVS    Elson Areas, PA-C 02/21/14 1017

## 2014-02-23 ENCOUNTER — Telehealth: Payer: Self-pay | Admitting: Family Medicine

## 2014-02-23 MED ORDER — TRAMADOL HCL 50 MG PO TABS: 50.0000 mg | ORAL_TABLET | Freq: Every evening | ORAL | Status: DC | PRN

## 2014-02-23 NOTE — ED Notes (Signed)
Patient continues to cough despite medications prescribed on the 29th. We'll prescribe 15 tablets of tramadol for cough suppression.  Rodolph BongEvan S Braydyn Schultes, MD 02/23/14 [phone]

## 2014-02-27 ENCOUNTER — Emergency Department (INDEPENDENT_AMBULATORY_CARE_PROVIDER_SITE_OTHER): Payer: BC Managed Care – PPO

## 2014-02-27 ENCOUNTER — Encounter: Payer: Self-pay | Admitting: Emergency Medicine

## 2014-02-27 ENCOUNTER — Telehealth: Payer: Self-pay | Admitting: Emergency Medicine

## 2014-02-27 ENCOUNTER — Emergency Department
Admission: EM | Admit: 2014-02-27 | Discharge: 2014-02-27 | Disposition: A | Discharge status: Home / Self Care | Payer: Self-pay | Source: Home / Self Care | Attending: Emergency Medicine | Admitting: Emergency Medicine

## 2014-02-27 DIAGNOSIS — F17210 Nicotine dependence, cigarettes, uncomplicated: Secondary | ICD-10-CM

## 2014-02-27 DIAGNOSIS — R05 Cough: Secondary | ICD-10-CM

## 2014-02-27 DIAGNOSIS — R0989 Other specified symptoms and signs involving the circulatory and respiratory systems: Secondary | ICD-10-CM

## 2014-02-27 DIAGNOSIS — R059 Cough, unspecified: Secondary | ICD-10-CM

## 2014-02-27 DIAGNOSIS — J208 Acute bronchitis due to other specified organisms: Secondary | ICD-10-CM

## 2014-02-27 DIAGNOSIS — J209 Acute bronchitis, unspecified: Secondary | ICD-10-CM

## 2014-02-27 MED ORDER — METHYLPREDNISOLONE SODIUM SUCC 40 MG IJ SOLR
125.0000 mg | Freq: Once | INTRAMUSCULAR | Status: AC
  Administered 2014-02-27: 125 mg via INTRAMUSCULAR

## 2014-02-27 MED ORDER — IPRATROPIUM-ALBUTEROL 0.5-2.5 (3) MG/3ML IN SOLN
3.0000 mL | RESPIRATORY_TRACT | Status: DC
  Administered 2014-02-27: 3 mL via RESPIRATORY_TRACT

## 2014-02-27 MED ORDER — HYDROCODONE-HOMATROPINE 5-1.5 MG/5ML PO SYRP: 5.0000 mL | ORAL_SOLUTION | Freq: Four times a day (QID) | ORAL | Status: DC | PRN

## 2014-02-27 MED ORDER — AZITHROMYCIN 250 MG PO TABS: 250.0000 mg | ORAL_TABLET | Freq: Every day | ORAL | Status: DC

## 2014-02-27 MED ORDER — IPRATROPIUM-ALBUTEROL 0.5-2.5 (3) MG/3ML IN SOLN: 3.0000 mL | Freq: Once | RESPIRATORY_TRACT | Status: DC

## 2014-02-27 NOTE — ED Notes (Signed)
States no improvement since [facility] visit 02-22-2015; coughing constantly and feels pressure in chest.

## 2014-02-27 NOTE — ED Provider Notes (Signed)
CSN: [id number]     Arrival date & time 02/27/14  1435 History   First MD Initiated Contact with Patient 02/27/14 1455     Chief Complaint  Patient presents with  . Cough   (Consider location/radiation/quality/duration/timing/severity/associated sxs/prior Treatment) Patient is a 39 y.o. female presenting with cough. The history is provided by the patient. No language interpreter was used.  Cough Cough characteristics:  Non-productive Severity:  Moderate Onset quality:  Gradual Duration:  1 week Timing:  Constant Progression:  Worsening Chronicity:  New Smoker: yes   Context: upper respiratory infection   Relieved by:  Nothing Worsened by:  Nothing tried Ineffective treatments:  Beta-agonist inhaler Associated symptoms: no fever, no shortness of breath and no sinus congestion     Past Medical History  Diagnosis Date  . Thyroid disease   . Depression    Past Surgical History  Procedure Laterality Date  . Appendectomy    . Cesarean    . Hernia repair     Family History  Problem Relation Age of Onset  . Diabetes Mother   . Cancer Father     Lung   History  Substance Use Topics  . Smoking status: Current Every Day Smoker -- 1.00 packs/day for 25 years    Types: Cigarettes  . Smokeless tobacco: Never Used  . Alcohol Use: 0.6 oz/week    1 Shots of liquor per week   OB History    No data available     Review of Systems  Constitutional: Negative for fever.  Respiratory: Positive for cough. Negative for shortness of breath.   All other systems reviewed and are negative.   Allergies  Amoxicillin; Penicillins; and Sulfonamide derivatives  Home Medications   Prior to Admission medications   Medication Sig Start Date End Date Taking? Authorizing Provider  albuterol (PROVENTIL HFA;VENTOLIN HFA) 108 (90 BASE) MCG/ACT inhaler Inhale 2 puffs into the lungs every 4 (four) hours as needed for wheezing. 02/21/14   Elson Areas, PA-C  benzonatate (TESSALON) 100 MG  capsule Take 1 capsule (100 mg total) by mouth every 8 (eight) hours. As needed for cough 01/14/13   Lajean Manes, MD  fluticasone [facility]) 50 MCG/ACT nasal spray 1 or 2 sprays each nostril twice a day 06/05/13   Lajean Manes, MD  levofloxacin (LEVAQUIN) 500 MG tablet Take 1 tablet daily X 10 days. 02/21/14   Elson Areas, PA-C  levonorgestrel (MIRENA) 20 MCG/24HR IUD 1 each by Intrauterine route once.      Historical Provider, MD  levothyroxine (SYNTHROID, LEVOTHROID) 200 MCG tablet Take 275 mcg by mouth daily.      Historical Provider, MD  PARoxetine (PAXIL) 40 MG tablet Take 40 mg by mouth every morning.      Historical Provider, MD  predniSONE (STERAPRED UNI-PAK) 10 MG tablet Take as directed for 6 days.--Take 6 on day 1, 5 on day 2, 4 on day 3, then 3 tablets on day 4, then 2 tablets on day 5, then 1 on day 6. 02/21/14   Elson Areas, PA-C  promethazine-codeine [facility] WITH CODEINE) 6.25-10 MG/5ML syrup Take 1-2 teaspoons every 4-6 hours as needed for cough. May cause drowsiness. 01/14/13   Lajean Manes, MD  traMADol (ULTRAM) 50 MG tablet Take 1 tablet (50 mg total) by mouth at bedtime as needed (cough). 02/23/14   Rodolph Bong, MD  valACYclovir (VALTREX) 1000 MG tablet Take 2 tablets (2,000 mg total) by mouth 2 (two) times daily. For fever blisters/cold sores 01/14/13  Lajean Manesavid Massey, MD   BP 162/76 mmHg  Pulse 59  Temp(Src) 98.2 F (36.8 C) (Oral)  Resp 18  SpO2 97% Physical Exam  Constitutional: She is oriented to person, place, and time. She appears well-developed and well-nourished.  HENT:  Head: Normocephalic.  Right Ear: External ear normal.  Left Ear: External ear normal.  Nose: Nose normal.  Mouth/Throat: Oropharynx is clear and moist.  Eyes: Conjunctivae and EOM are normal. Pupils are equal, round, and reactive to light.  Neck: Normal range of motion.  Cardiovascular: Normal rate and normal heart sounds.   Pulmonary/Chest: Effort normal.  Abdominal: Soft. She  exhibits no distension.  Musculoskeletal: Normal range of motion.  Neurological: She is alert and oriented to person, place, and time.  Skin: Skin is warm.  Psychiatric: She has a normal mood and affect.  Nursing note and vitals reviewed.   ED Course  Procedures (including critical care time) Labs Review Labs Reviewed - No data to display  Imaging Review Dg Chest 2 View  02/27/2014   CLINICAL DATA:  Cough.  Congestion.  Cigarette smoker.  EXAM: CHEST  2 VIEW  COMPARISON:  02/06/2011.  01/14/2013.  FINDINGS: Cardiopericardial silhouette within normal limits. Mediastinal contours normal. Trachea midline. No airspace disease or effusion. Chronic bronchitic changes are present at the lung bases, more prominent on the RIGHT than LEFT.  IMPRESSION: Chronic bronchitic changes without acute cardiopulmonary disease.   Electronically Signed   By: Andreas NewportGeoffrey  Lamke M.D.   On: 02/27/2014 15:26     MDM Pt given albuterol neb,  Solumedrol IM.   Pt advised to continue albuterol. Pt given rx for zithromax    1. Acute bronchitis due to other specified organisms   2. Cough        Elson AreasLeslie K Juanna Pudlo, PA-C 02/27/14 1747

## 2014-02-27 NOTE — Discharge Instructions (Signed)
Smoking Cessation Quitting smoking is important to your health and has many advantages. However, it is not always easy to quit since nicotine is a very addictive drug. Oftentimes, people try 3 times or more before being able to quit. This document explains the best ways for you to prepare to quit smoking. Quitting takes hard work and a lot of effort, but you can do it. ADVANTAGES OF QUITTING SMOKING  You will live longer, feel better, and live better.  Your body will feel the impact of quitting smoking almost immediately.  Within 20 minutes, blood pressure decreases. Your pulse returns to its normal level.  After 8 hours, carbon monoxide levels in the blood return to normal. Your oxygen level increases.  After 24 hours, the chance of having a heart attack starts to decrease. Your breath, hair, and body stop smelling like smoke.  After 48 hours, damaged nerve endings begin to recover. Your sense of taste and smell improve.  After 72 hours, the body is virtually free of nicotine. Your bronchial tubes relax and breathing becomes easier.  After 2 to 12 weeks, lungs can hold more air. Exercise becomes easier and circulation improves.  The risk of having a heart attack, stroke, cancer, or lung disease is greatly reduced.  After 1 year, the risk of coronary heart disease is cut in half.  After 5 years, the risk of stroke falls to the same as a nonsmoker.  After 10 years, the risk of lung cancer is cut in half and the risk of other cancers decreases significantly.  After 15 years, the risk of coronary heart disease drops, usually to the level of a nonsmoker.  If you are pregnant, quitting smoking will improve your chances of having a healthy baby.  The people you live with, especially any children, will be healthier.  You will have extra money to spend on things other than cigarettes. QUESTIONS TO THINK ABOUT BEFORE ATTEMPTING TO QUIT You may want to talk about your answers with your  health care provider.  Why do you want to quit?  If you tried to quit in the past, what helped and what did not?  What will be the most difficult situations for you after you quit? How will you plan to handle them?  Who can help you through the tough times? Your family? Friends? A health care provider?  What pleasures do you get from smoking? What ways can you still get pleasure if you quit? Here are some questions to ask your health care provider:  How can you help me to be successful at quitting?  What medicine do you think would be best for me and how should I take it?  What should I do if I need more help?  What is smoking withdrawal like? How can I get information on withdrawal? GET READY  Set a quit date.  Change your environment by getting rid of all cigarettes, ashtrays, matches, and lighters in your home, car, or work. Do not let people smoke in your home.  Review your past attempts to quit. Think about what worked and what did not. GET SUPPORT AND ENCOURAGEMENT You have a better chance of being successful if you have help. You can get support in many ways.  Tell your family, friends, and coworkers that you are going to quit and need their support. Ask them not to smoke around you.  Get individual, group, or telephone counseling and support. Programs are available at [employer] and health centers. Call  your local health department for information about programs in your area. °· Spiritual beliefs and practices may help some smokers quit. °· Download a "quit meter" on your computer to keep track of quit statistics, such as how long you have gone without smoking, cigarettes not smoked, and money saved. °· Get a self-help book about quitting smoking and staying off tobacco. °LEARN NEW SKILLS AND BEHAVIORS °· Distract yourself from urges to smoke. Talk to someone, go for a walk, or occupy your time with a task. °· Change your normal routine. Take a different route to work.  Drink tea instead of coffee. Eat breakfast in a different place. °· Reduce your stress. Take a hot bath, exercise, or read a book. °· Plan something enjoyable to do every day. Reward yourself for not smoking. °· Explore interactive web-based programs that specialize in helping you quit. °GET MEDICINE AND USE IT CORRECTLY °Medicines can help you stop smoking and decrease the urge to smoke. Combining medicine with the above behavioral methods and support can greatly increase your chances of successfully quitting smoking. °· Nicotine replacement therapy helps deliver nicotine to your body without the negative effects and risks of smoking. Nicotine replacement therapy includes nicotine gum, lozenges, inhalers, nasal sprays, and skin patches. Some may be available over-the-counter and others require a prescription. °· Antidepressant medicine helps people abstain from smoking, but how this works is unknown. This medicine is available by prescription. °· Nicotinic receptor partial agonist medicine simulates the effect of nicotine in your brain. This medicine is available by prescription. °Ask your health care provider for advice about which medicines to use and how to use them based on your health history. Your health care provider will tell you what side effects to look out for if you choose to be on a medicine or therapy. Carefully read the information on the package. Do not use any other product containing nicotine while using a nicotine replacement product.  °RELAPSE OR DIFFICULT SITUATIONS °Most relapses occur within the first 3 months after quitting. Do not be discouraged if you start smoking again. Remember, most people try several times before finally quitting. You may have symptoms of withdrawal because your body is used to nicotine. You may crave cigarettes, be irritable, feel very hungry, cough often, get headaches, or have difficulty concentrating. The withdrawal symptoms are only temporary. They are strongest  when you first quit, but they will go away within 10-14 days. °To reduce the chances of relapse, try to: °· Avoid drinking alcohol. Drinking lowers your chances of successfully quitting. °· Reduce the amount of caffeine you consume. Once you quit smoking, the amount of caffeine in your body increases and can give you symptoms, such as a rapid heartbeat, sweating, and anxiety. °· Avoid smokers because they can make you want to smoke. °· Do not let weight gain distract you. Many smokers will gain weight when they quit, usually less than 10 pounds. Eat a healthy diet and stay active. You can always lose the weight gained after you quit. °· Find ways to improve your mood other than smoking. °FOR MORE INFORMATION  °www.smokefree.gov  °Document Released: 02/04/2001 Document Revised: 06/27/2013 Document Reviewed: 05/22/2011 °ExitCare® Patient Information ©2015 [employer]. This information is not intended to replace advice given to you by your health care provider. Make sure you discuss any questions you have with your health care provider. °Acute Bronchitis °Bronchitis is inflammation of the airways that extend from the windpipe into the lungs (bronchi). The inflammation often causes mucus   to develop. This leads to a cough, which is the most common symptom of bronchitis.  °In acute bronchitis, the condition usually develops suddenly and goes away over time, usually in a couple weeks. Smoking, allergies, and asthma can make bronchitis worse. Repeated episodes of bronchitis may cause further lung problems.  °CAUSES °Acute bronchitis is most often caused by the same virus that causes a cold. The virus can spread from person to person (contagious) through coughing, sneezing, and touching contaminated objects. °SIGNS AND SYMPTOMS  °· Cough.   °· Fever.   °· Coughing up mucus.   °· Body aches.   °· Chest congestion.   °· Chills.   °· Shortness of breath.   °· Sore throat.   °DIAGNOSIS  °Acute bronchitis is usually diagnosed  through a physical exam. Your health care provider will also ask you questions about your medical history. Tests, such as chest X-rays, are sometimes done to rule out other conditions.  °TREATMENT  °Acute bronchitis usually goes away in a couple weeks. Oftentimes, no medical treatment is necessary. Medicines are sometimes given for relief of fever or cough. Antibiotic medicines are usually not needed but may be prescribed in certain situations. In some cases, an inhaler may be recommended to help reduce shortness of breath and control the cough. A cool mist vaporizer may also be used to help thin bronchial secretions and make it easier to clear the chest.  °HOME CARE INSTRUCTIONS °· Get plenty of rest.   °· Drink enough fluids to keep your urine clear or pale yellow (unless you have a medical condition that requires fluid restriction). Increasing fluids may help thin your respiratory secretions (sputum) and reduce chest congestion, and it will prevent dehydration.   °· Take medicines only as directed by your health care provider. °· If you were prescribed an antibiotic medicine, finish it all even if you start to feel better. °· Avoid smoking and secondhand smoke. Exposure to cigarette smoke or irritating chemicals will make bronchitis worse. If you are a smoker, consider using nicotine gum or skin patches to help control withdrawal symptoms. Quitting smoking will help your lungs heal faster.   °· Reduce the chances of another bout of acute bronchitis by washing your hands frequently, avoiding people with cold symptoms, and trying not to touch your hands to your mouth, nose, or eyes.   °· Keep all follow-up visits as directed by your health care provider.   °SEEK MEDICAL CARE IF: °Your symptoms do not improve after 1 week of treatment.  °SEEK IMMEDIATE MEDICAL CARE IF: °· You develop an increased fever or chills.   °· You have chest pain.   °· You have severe shortness of breath. °· You have bloody sputum.   °· You  develop dehydration. °· You faint or repeatedly feel like you are going to pass out. °· You develop repeated vomiting. °· You develop a severe headache. °MAKE SURE YOU:  °· Understand these instructions. °· Will watch your condition. °· Will get help right away if you are not doing well or get worse. °Document Released: 03/20/2004 Document Revised: 06/27/2013 Document Reviewed: 08/03/2012 °ExitCare® Patient Information ©2015 [employer]. This information is not intended to replace advice given to you by your health care provider. Make sure you discuss any questions you have with your health care provider. ° °

## 2014-07-22 ENCOUNTER — Emergency Department
Admission: EM | Admit: 2014-07-22 | Discharge: 2014-07-22 | Disposition: A | Discharge status: Home / Self Care | Payer: Self-pay | Source: Home / Self Care

## 2014-07-22 ENCOUNTER — Encounter: Payer: Self-pay | Admitting: Emergency Medicine

## 2014-07-22 DIAGNOSIS — J209 Acute bronchitis, unspecified: Secondary | ICD-10-CM

## 2014-07-22 MED ORDER — AZITHROMYCIN 250 MG PO TABS: ORAL_TABLET | ORAL | Status: DC

## 2014-07-22 MED ORDER — PREDNISONE 20 MG PO TABS: 20.0000 mg | ORAL_TABLET | Freq: Two times a day (BID) | ORAL | Status: DC

## 2014-07-22 MED ORDER — PROMETHAZINE-CODEINE 6.25-10 MG/5ML PO SYRP: ORAL_SOLUTION | ORAL | Status: DC

## 2014-07-22 MED ORDER — METHYLPREDNISOLONE ACETATE 80 MG/ML IJ SUSP
80.0000 mg | Freq: Once | INTRAMUSCULAR | Status: AC
  Administered 2014-07-22: 80 mg via INTRAMUSCULAR

## 2014-07-22 NOTE — ED Notes (Signed)
Pt c/o cough and chest congestion x3 days. Denies fever. Taking mucinex with no relief.

## 2014-07-22 NOTE — ED Provider Notes (Addendum)
CSN: [id number]     Arrival date & time 07/22/14  1748 History   None    Chief Complaint  Patient presents with  . Cough   (Consider location/radiation/quality/duration/timing/severity/associated sxs/prior Treatment) HPI Pt c/o cough and chest congestion x3 days. Denies fever. Taking mucinex with no relief.   Remainder of Review of Systems negative for acute change except as noted in the HPI.  Past Medical History  Diagnosis Date  . Thyroid disease   . Depression    Past Surgical History  Procedure Laterality Date  . Appendectomy    . Cesarean    . Hernia repair     Family History  Problem Relation Age of Onset  . Diabetes Mother   . Cancer Father     Lung   History  Substance Use Topics  . Smoking status: Current Every Day Smoker -- 1.00 packs/day for 25 years    Types: Cigarettes  . Smokeless tobacco: Never Used  . Alcohol Use: 0.6 oz/week    1 Shots of liquor per week   OB History    No data available     Review of Systems  Allergies  Amoxicillin; Penicillins; and Sulfonamide derivatives  Home Medications   Prior to Admission medications   Medication Sig Start Date End Date Taking? Authorizing Provider  albuterol (PROVENTIL HFA;VENTOLIN HFA) 108 (90 BASE) MCG/ACT inhaler Inhale 2 puffs into the lungs every 4 (four) hours as needed for wheezing. 02/21/14   Elson Areas, PA-C  azithromycin (ZITHROMAX Z-PAK) 250 MG tablet Take 2 tablets on day one, then 1 tablet daily on days 2 through 5 07/22/14   Lajean Manes, MD  fluticasone [facility]) 50 MCG/ACT nasal spray 1 or 2 sprays each nostril twice a day 06/05/13   Lajean Manes, MD  levonorgestrel (MIRENA) 20 MCG/24HR IUD 1 each by Intrauterine route once.      Historical Provider, MD  levothyroxine (SYNTHROID, LEVOTHROID) 200 MCG tablet Take 275 mcg by mouth daily.      Historical Provider, MD  PARoxetine (PAXIL) 40 MG tablet Take 40 mg by mouth every morning.      Historical Provider, MD  predniSONE (DELTASONE)  20 MG tablet Take 1 tablet (20 mg total) by mouth 2 (two) times daily with a meal. For 5 days 07/22/14   Lajean Manes, MD  promethazine-codeine [facility] WITH CODEINE) 6.25-10 MG/5ML syrup Take 1-2 teaspoons every 4-6 hours as needed for cough. May cause drowsiness. 07/22/14   Lajean Manes, MD   BP 136/84 mmHg  Pulse 76  Temp(Src) 98.2 F (36.8 C) (Oral)  SpO2 97% Physical Exam  Constitutional: She is oriented to person, place, and time. She appears well-developed and well-nourished. No distress.  HENT:  Head: Normocephalic and atraumatic.  Right Ear: Tympanic membrane normal.  Left Ear: Tympanic membrane normal.  Nose: Nose normal.  Mouth/Throat: Oropharynx is clear and moist. No oropharyngeal exudate.  Eyes: Right eye exhibits no discharge. Left eye exhibits no discharge. No scleral icterus.  Neck: Neck supple.  Cardiovascular: Normal rate, regular rhythm and normal heart sounds.   Pulmonary/Chest: No accessory muscle usage. No respiratory distress. She has no decreased breath sounds. She has wheezes. She has rhonchi. She has no rales.  Lymphadenopathy:    She has no cervical adenopathy.  Neurological: She is alert and oriented to person, place, and time.  Skin: Skin is warm and dry.  Nursing note and vitals reviewed.  Pulse ox 97% on room air ED Course  Procedures (including critical care  time) Labs Review Labs Reviewed - No data to display  Imaging Review No results found. She declined any imaging  MDM   1. Acute bronchitis with bronchospasm    Treatment options discussed, as well as risks, benefits, alternatives. Patient voiced understanding and agreement with the following plans: She states that steroid shot and prednisone and Z-Pak have helped with similar symptoms in the past. Depo-Medrol 80 mg IM stat New Prescriptions   AZITHROMYCIN (ZITHROMAX Z-PAK) 250 MG TABLET    Take 2 tablets on day one, then 1 tablet daily on days 2 through 5   PREDNISONE (DELTASONE) 20  MG TABLET    Take 1 tablet (20 mg total) by mouth 2 (two) times daily with a meal. For 5 days   PROMETHAZINE-CODEINE (PHENERGAN WITH CODEINE) 6.25-10 MG/5ML SYRUP    Take 1-2 teaspoons every 4-6 hours as needed for cough. May cause drowsiness.   I also advised patient to quit smoking. Other symptomatic care discussed Follow-up with your primary care doctor in 5-7 days if not improving, or sooner if symptoms become worse. Precautions discussed. Red flags discussed. Questions invited and answered. Patient voiced understanding and agreement.    Lajean Manesavid Bethany Cumming, MD 07/22/14 1813  Lajean Manesavid Graysyn Bache, MD 07/22/14 [phone]

## 2016-01-04 ENCOUNTER — Emergency Department (INDEPENDENT_AMBULATORY_CARE_PROVIDER_SITE_OTHER): Payer: Self-pay

## 2016-01-04 ENCOUNTER — Emergency Department
Admission: EM | Admit: 2016-01-04 | Discharge: 2016-01-04 | Disposition: A | Discharge status: Home / Self Care | Payer: Self-pay | Source: Home / Self Care | Attending: Family Medicine | Admitting: Family Medicine

## 2016-01-04 DIAGNOSIS — M25462 Effusion, left knee: Secondary | ICD-10-CM

## 2016-01-04 DIAGNOSIS — S8392XA Sprain of unspecified site of left knee, initial encounter: Secondary | ICD-10-CM

## 2016-01-04 MED ORDER — HYDROCODONE-ACETAMINOPHEN 5-325 MG PO TABS: ORAL_TABLET | ORAL | 0 refills | Status: DC

## 2016-01-04 NOTE — ED Provider Notes (Signed)
[facility]    CSN: [id number] Arrival date & time: 01/04/16  1653     History   Chief Complaint Chief Complaint  Patient presents with  . Knee Pain    left    HPI Tracy Carr is a 40 y.o. female.   Patient reports that she developed vague soreness in her left knee five days ago.  Today she took a quick step and felt a popping sensation behind her left kneecap.  She now has pain with walking and weight bearing.   The history is provided by the patient.  Knee Pain  Time since incident:  5 hours Injury: yes   Mechanism of injury comment:  Twisted knee Pain details:    Quality:  Aching   Radiates to:  Does not radiate   Severity:  Moderate   Onset quality:  Sudden   Duration:  5 hours   Timing:  Constant   Progression:  Unchanged Chronicity:  New Prior injury to area:  No Relieved by:  None tried Worsened by:  Bearing weight Ineffective treatments:  None tried Associated symptoms: decreased ROM and stiffness   Associated symptoms: no back pain, no muscle weakness, no numbness, no swelling and no tingling   Risk factors: obesity     Past Medical History:  Diagnosis Date  . Depression   . Thyroid disease     Patient Active Problem List   Diagnosis Date Noted  . HEEL PAIN, RIGHT 09/14/2009  . URI 04/24/2009  . BRONCHITIS, ACUTE 01/11/2009  . DISTURBANCE OF SKIN SENSATION 04/21/2008  . HYPOTHYROIDISM 02/19/2008  . ANXIETY 02/19/2008  . ACUTE CYSTITIS 02/19/2008    Past Surgical History:  Procedure Laterality Date  . APPENDECTOMY    . cesarean    . HERNIA REPAIR      OB History    No data available       Home Medications    Prior to Admission medications   Medication Sig Start Date End Date Taking? Authorizing Provider  albuterol (PROVENTIL HFA;VENTOLIN HFA) 108 (90 BASE) MCG/ACT inhaler Inhale 2 puffs into the lungs every 4 (four) hours as needed for wheezing. 02/21/14   Elson Areas, PA-C  azithromycin (ZITHROMAX Z-PAK)  250 MG tablet Take 2 tablets on day one, then 1 tablet daily on days 2 through 5 07/22/14   Lajean Manes, MD  fluticasone [facility]) 50 MCG/ACT nasal spray 1 or 2 sprays each nostril twice a day 06/05/13   Lajean Manes, MD  HYDROcodone-acetaminophen (NORCO/VICODIN) 5-325 MG tablet Take one by mouth at bedtime as needed for pain 01/04/16   Lattie Haw, MD  levonorgestrel (MIRENA) 20 MCG/24HR IUD 1 each by Intrauterine route once.      Historical Provider, MD  levothyroxine (SYNTHROID, LEVOTHROID) 200 MCG tablet Take 275 mcg by mouth daily.      Historical Provider, MD  PARoxetine (PAXIL) 40 MG tablet Take 40 mg by mouth every morning.      Historical Provider, MD  predniSONE (DELTASONE) 20 MG tablet Take 1 tablet (20 mg total) by mouth 2 (two) times daily with a meal. For 5 days 07/22/14   Lajean Manes, MD  promethazine-codeine [facility] WITH CODEINE) 6.25-10 MG/5ML syrup Take 1-2 teaspoons every 4-6 hours as needed for cough. May cause drowsiness. 07/22/14   Lajean Manes, MD    Family History Family History  Problem Relation Age of Onset  . Diabetes Mother   . Cancer Father     Lung  Social History Social History  Substance Use Topics  . Smoking status: Current Every Day Smoker    Packs/day: 1.00    Years: 25.00    Types: Cigarettes  . Smokeless tobacco: Never Used  . Alcohol use 0.6 oz/week    1 Shots of liquor per week     Allergies   Amoxicillin; Penicillins; and Sulfonamide derivatives   Review of Systems Review of Systems  Musculoskeletal: Positive for stiffness. Negative for back pain.  All other systems reviewed and are negative.    Physical Exam Triage Vital Signs ED Triage Vitals  Enc Vitals Group     BP      Pulse      Resp      Temp      Temp src      SpO2      Weight      Height      Head Circumference      Peak Flow      Pain Score      Pain Loc      Pain Edu?      Excl. in GC?    No data found.   Updated Vital Signs BP 167/98 (BP  Location: Left Arm)   Pulse 68   Temp 98 F (36.7 C) (Oral)   Ht 5\' 7"  (1.702 m)   Wt 275 lb (124.7 kg)   LMP 09/12/2015 (Approximate)   SpO2 98%   BMI 43.07 kg/m   Visual Acuity Right Eye Distance:   Left Eye Distance:   Bilateral Distance:    Right Eye Near:   Left Eye Near:    Bilateral Near:     Physical Exam  Constitutional: She appears well-developed and well-nourished. No distress.  HENT:  Head: Atraumatic.  Right Ear: External ear normal.  Left Ear: External ear normal.  Nose: Nose normal.  Mouth/Throat: Oropharynx is clear and moist.  Eyes: Conjunctivae are normal. Pupils are equal, round, and reactive to light.  Neck: Normal range of motion.  Cardiovascular: Normal heart sounds.   Pulmonary/Chest: Breath sounds normal.  Abdominal: There is no tenderness.  Musculoskeletal: She exhibits no edema.       Left knee: She exhibits decreased range of motion. She exhibits no swelling, no effusion, no ecchymosis, no deformity, no laceration, no erythema, normal alignment, no LCL laxity and normal patellar mobility. Tenderness found.  Left knee:  No effusion, erythema, or warmth.  Knee stable, negative drawer test.  McMurray test negative.  There is vague tenderness to palpation above and to lateral aspect of patella.  No tenderness over iliotibial band or pes bursa.  Skin: Skin is warm and dry.  Nursing note and vitals reviewed.    UC Treatments / Results  Labs (all labs ordered are listed, but only abnormal results are displayed) Labs Reviewed - No data to display  EKG  EKG Interpretation None       Radiology Dg Knee Ap/lat W/sunrise Left  Result Date: 01/04/2016 CLINICAL DATA:  Left knee pain for 1 week pain behind the patella EXAM: LEFT KNEE 3 VIEWS COMPARISON:  None. FINDINGS: No fracture or dislocation is evident. Joint space compartments are relatively maintained. Minimal spurring of the superior patella. Minimal suprapatellar effusion. IMPRESSION: No  acute fracture or malalignment.  Minimal suprapatellar effusion. Electronically Signed   By: Jasmine Pang M.D.   On: 01/04/2016 17:51    Procedures Procedures (including critical care time)  Medications Ordered in UC Medications - No data to  display   Initial Impression / Assessment and Plan / UC Course  I have reviewed the triage vital signs and the nursing notes.  Pertinent labs & imaging results that were available during my care of the patient were reviewed by me and considered in my medical decision making (see chart for details).  Clinical Course   ?meniscus injury. Ace wrap applied. Apply ice pack for 30 minutes every 1 to 2 hours today and tomorrow.  Elevate.  Use crutches for 3 to 5 days.  Wear Ace wrap until swelling decreases.  May take Ibuprofen , 4 tabs every 8 hours with food.  Followup with Dr. Rodney Langton or Dr. Clementeen [facility] ([facility]) for further evaluation and management.     Final Clinical Impressions(s) / UC Diagnoses   Final diagnoses:  Sprain of left knee, unspecified ligament, initial encounter    New Prescriptions New Prescriptions   HYDROCODONE-ACETAMINOPHEN (NORCO/VICODIN) 5-325 MG TABLET    Take one by mouth at bedtime as needed for pain     Lattie Haw, MD 01/08/16 [phone]

## 2016-01-04 NOTE — ED Triage Notes (Signed)
Pt has been having knee pain since Sunday.  Today, she hopped, and felt a pop behind her knee cap.  When she is not moving, pain level is a 4 when moving or standing, it is a 9.

## 2016-01-04 NOTE — ED Notes (Signed)
To X-ray

## 2016-01-04 NOTE — Discharge Instructions (Signed)
Apply ice pack for 30 minutes every 1 to 2 hours today and tomorrow.  Elevate.  Use crutches for 3 to 5 days.  Wear Ace wrap until swelling decreases.  May take Ibuprofen 200mg , 4 tabs every 8 hours with food.

## 2016-01-07 ENCOUNTER — Encounter: Payer: Self-pay | Admitting: Family Medicine

## 2016-01-07 ENCOUNTER — Ambulatory Visit (INDEPENDENT_AMBULATORY_CARE_PROVIDER_SITE_OTHER): Payer: Self-pay | Admitting: Family Medicine

## 2016-01-07 DIAGNOSIS — S8992XA Unspecified injury of left lower leg, initial encounter: Secondary | ICD-10-CM

## 2016-01-07 MED ORDER — DICLOFENAC SODIUM 1 % TD GEL: 4.0000 g | Freq: Four times a day (QID) | TRANSDERMAL | 11 refills | Status: DC

## 2016-01-07 NOTE — Progress Notes (Signed)
   Subjective:    I'm seeing this patient as a consultation for:  Dr. Donna ChristenStephen Beese.    CC: Left knee pain  HPI: Patient is a 40 y.o. Female with left knee pain that started 3 days ago when she was taking a large step over a puddle. Her foot landed normally without twisting, but she heard a snap. When she took her next step, she could bear weight, but it was painful. She noticed some swelling when she next looked at her knee 30 min later. Since then, pain and swelling have improved with ibuprofen, ice, and compression with an ACE bandage, but her knee still bothers her intermittently when she walks.   Past medical history, Surgical history, Family history not pertinant except as noted below, Social history, Allergies, and medications have been entered into the medical record, reviewed, and no changes needed.   Review of Systems: No headache, visual changes, nausea, vomiting, diarrhea, constipation, dizziness, abdominal pain, skin rash, fevers, chills, night sweats, weight loss, swollen lymph nodes, body aches, joint swelling, muscle aches, chest pain, shortness of breath, mood changes, visual or auditory hallucinations.   Objective:    Vitals:   01/07/16 1106  BP: (!) 161/77  Pulse: 66   General: Well Developed, well nourished, and in no acute distress.  Neuro/Psych: Alert and oriented x3, extra-ocular muscles intact, able to move all 4 extremities, sensation grossly intact. Skin: Warm and dry, no rashes noted.  Respiratory: Not using accessory muscles, speaking in full sentences, trachea midline.  Cardiovascular: Pulses palpable, no extremity edema. Abdomen: Does not appear distended. MSK: Left knee: Normal appearance with no significant effusion Range of motion 0-120 with no significant retropatellar crepitations   but tender to palpation along the medial lateral joint lines. Moderately tender to palpation at the distal insertion of the patellar tendon and mildly tender to palpation  at the proximal insertion of the patella tendon  Stable ligamentous exam to valgus and varus stress testing. Normal posterior drawer testing. Slightly lax anterior drawer testing without pain. Negative McMurray's testing Mild antalgic gait Intact extension and flexion strength however patient does have some pain with resisted extension strength.   No results found for this or any previous visit (from the past 24 hour(s)). No results found.   L knee x-ray 01/04/16: No acute fracture or malalignment. Minimal suprapatellar effusion.  Impression and Recommendations:    Assessment and Plan: 40 y.o. female with R knee pain that started after hearing a snap when taking a large step 3 days ago, associated with swelling and pain that have since improved. Etiology unclear, possibly meniscus injury or patellar tendonitis. Plan on hinge knee brace and voltaren gel, follow up in 2 weeks for MRI if not improved.    Discussed warning signs or symptoms. Please see discharge instructions. Patient expresses understanding.

## 2016-01-07 NOTE — Patient Instructions (Signed)
Thank you for coming in today. Use voltaren gel and ice and rest.  Use the knee brace.  Return or call in 2 weeks if not better.   At that time I will likely order a MRI.    Meniscus Tear With Phase I Rehab The meniscus is a C-shaped cartilage structure, located in the knee joint between the thigh bone (femur) and the shinbone (tibia). Two menisci are located in each knee joint: the inner and outer meniscus. The meniscus acts as an adapter between the thigh bone and shinbone, allowing them to fit properly together. It also functions as a shock absorber, to reduce the stress placed on the knee joint and to help supply nutrients to the knee joint cartilage. As people age, the meniscus begins to harden and become more vulnerable to injury. Meniscus tears are a common injury, especially in older athletes. Inner meniscus tears are more common than outer meniscus tears.  SYMPTOMS   Pain in the knee, especially with standing or squatting with the affected leg.  Tenderness along the joint line.  Swelling in the knee joint (effusion), usually starting 1 to 2 days after injury.  Locking or catching of the knee joint, causing inability to straighten the knee completely.  Giving way or buckling of the knee. CAUSES  A meniscus tear occurs when a force is placed on the meniscus that is greater than it can handle. Common causes of injury include:  Direct hit (trauma) to the knee.  Twisting, pivoting, or cutting (rapidly changing direction while running), kneeling or squatting.  Without injury, due to aging. RISK INCREASES WITH:  Contact sports (football, rugby).  Sports in which cleats are used with pivoting (soccer, lacrosse) or sports in which good shoe grip and sudden change in direction are required (racquetball, basketball, squash).  Previous knee injury.  Associated knee injury, particularly ligament injuries.  Poor strength and flexibility. PREVENTION  Warm up and stretch properly  before activity.  Maintain physical fitness:  Strength, flexibility, and endurance.  Cardiovascular fitness.  Protect the knee with a brace or elastic bandage.  Wear properly fitted protective equipment (proper cleats for the surface). PROGNOSIS  Sometimes, meniscus tears heal on their own. However, definitive treatment requires surgery, followed by at least 6 weeks of recovery.  RELATED COMPLICATIONS   Recurring symptoms that result in a chronic problem.  Repeated knee injury, especially if sports are resumed too soon after injury or surgery.  Progression of the tear (the tear gets larger), if untreated.  Arthritis of the knee in later years (with or without surgery).  Complications of surgery, including infection, bleeding, injury to nerves (numbness, weakness, paralysis) continued pain, giving way, locking, nonhealing of meniscus (if repaired), need for further surgery, and knee stiffness (loss of motion). TREATMENT  Treatment first involves the use of ice and medicine, to reduce pain and inflammation. You may find using crutches to walk more comfortable. However, it is okay to bear weight on the injured knee, if the pain will allow it. Surgery is often advised as a definitive treatment. Surgery is performed through an incision near the joint (arthroscopically). The torn piece of the meniscus is removed, and if possible the joint cartilage is repaired. After surgery, the joint must be restrained. After restraint, it is important to perform strengthening and stretching exercises to help regain strength and a full range of motion. These exercises may be completed at home or with a therapist.  MEDICATION  If pain medicine is needed, nonsteroidal anti-inflammatory medicines (  aspirin and ibuprofen), or other minor pain relievers (acetaminophen), are often advised.  Do not take pain medicine for 7 days before surgery.  Prescription pain relievers may be given, if your caregiver thinks  they are needed. Use only as directed and only as much as you need. HEAT AND COLD  Cold treatment (icing) should be applied for 10 to 15 minutes every 2 to 3 hours for inflammation and pain, and immediately after activity that aggravates your symptoms. Use ice packs or an ice massage.  Heat treatment may be used before performing stretching and strengthening activities prescribed by your caregiver, physical therapist, or athletic trainer. Use a heat pack or a warm water soak. SEEK MEDICAL CARE IF:   Symptoms get worse or do not improve in 2 weeks, despite treatment.  New, unexplained symptoms develop. (Drugs used in treatment may produce side effects.) EXERCISES RANGE OF MOTION (ROM) AND STRETCHING EXERCISES - Meniscus Tear, Non-operative, Phase I These are some of the initial exercises with which you may start your rehabilitation program, until you see your caregiver again or until your symptoms are resolved. Remember:   These initial exercises are intended to be gentle. They will help you restore motion without increasing any swelling.  Completing these exercises allows less painful movement and prepares you for the more aggressive strengthening exercises in Phase II.  An effective stretch should be held for at least 30 seconds.  A stretch should never be painful. You should only feel a gentle lengthening or release in the stretched tissue. RANGE OF MOTION - Knee Flexion, Active  Lie on your back with both knees straight. (If this causes back discomfort, bend your healthy knee, placing your foot flat on the floor.)  Slowly slide your heel back toward your buttocks until you feel a gentle stretch in the front of your knee or thigh.  Hold for __________ seconds. Slowly slide your heel back to the starting position. Repeat __________ times. Complete this exercise __________ times per day.  RANGE OF MOTION - Knee Flexion and Extension, Active-Assisted  Sit on the edge of a table or  chair with your thighs firmly supported. It may be helpful to place a folded towel under the end of your right / left thigh.  Flexion (bending): Place the ankle of your healthy leg on top of the other ankle. Use your healthy leg to gently bend your right / left knee until you feel a mild tension across the top of your knee.  Hold for __________ seconds.  Extension (straightening): Switch your ankles so your right / left leg is on top. Use your healthy leg to straighten your right / left knee until you feel a mild tension on the backside of your knee.  Hold for __________ seconds. Repeat __________ times. Complete __________ times per day. STRETCH - Knee Flexion, Supine  Lie on the floor with your right / left heel and foot lightly touching the wall. (Place both feet on the wall if you do not use a door frame.)  Without using any effort, allow gravity to slide your foot down the wall slowly until you feel a gentle stretch in the front of your right / left knee.  Hold this stretch for __________ seconds. Then return the leg to the starting position, using your healthy leg for help, if needed. Repeat __________ times. Complete this stretch __________ times per day.  STRETCH - Knee Extension Sitting  Sit with your right / left leg/heel propped on another chair, coffee  table, or foot stool.  Allow your leg muscles to relax, letting gravity straighten out your knee.*  You should feel a stretch behind your right / left knee. Hold this position for __________ seconds. Repeat __________ times. Complete this stretch __________ times per day.  *Your physician, physical therapist or athletic trainer may instruct you place a __________ weight on your thigh, just above your kneecap, to deepen the stretch.  STRENGTHENING EXERCISES - Meniscus Tear, Non-operative, Phase I These exercises may help you when beginning to rehabilitate your injury. They may resolve your symptoms with or without further  involvement from your physician, physical therapist or athletic trainer. While completing these exercises, remember:   Muscles can gain both the endurance and the strength needed for everyday activities through controlled exercises.  Complete these exercises as instructed by your physician, physical therapist or athletic trainer. Progress the resistance and repetitions only as guided. STRENGTH - Quadriceps, Isometrics  Lie on your back with your right / left leg extended and your opposite knee bent.  Gradually tense the muscles in the front of your right / left thigh. You should see either your knee cap slide up toward your hip or increased dimpling just above the knee. This motion will push the back of the knee down toward the floor, mat, or bed on which you are lying.  Hold the muscle as tight as you can, without increasing your pain, for __________ seconds.  Relax the muscles slowly and completely between each repetition. Repeat __________ times. Complete this exercise __________ times per day.  STRENGTH - Quadriceps, Short Arcs   Lie on your back. Place a __________ inch towel roll under your right / left knee, so that the knee bends slightly.  Raise only your lower leg by tightening the muscles in the front of your thigh. Do not allow your thigh to rise.  Hold this position for __________ seconds. Repeat __________ times. Complete this exercise __________ times per day.  OPTIONAL ANKLE WEIGHTS: Begin with ____________________, but DO NOT exceed ____________________. Increase in 1 pound/0.5 kilogram increments. STRENGTH - Quadriceps, Straight Leg Raises  Quality counts! Watch for signs that the quadriceps muscle is working, to be sure you are strengthening the correct muscles and not "cheating" by substituting with healthier muscles.  Lay on your back with your right / left leg extended and your opposite knee bent.  Tense the muscles in the front of your right / left thigh. You  should see either your knee cap slide up or increased dimpling just above the knee. Your thigh may even shake a bit.  Tighten these muscles even more and raise your leg 4 to 6 inches off the floor. Hold for __________ seconds.  Keeping these muscles tense, lower your leg.  Relax the muscles slowly and completely in between each repetition. Repeat __________ times. Complete this exercise __________ times per day.  STRENGTH - Hamstring, Curls   Lay on your stomach with your legs extended. (If you lay on a bed, your feet may hang over the edge.)  Tighten the muscles in the back of your thigh to bend your right / left knee up to 90 degrees. Keep your hips flat on the bed.  Hold this position for __________ seconds.  Slowly lower your leg back to the starting position. Repeat __________ times. Complete this exercise __________ times per day.  STRENGTH - Quadriceps, Squats  Stand in a door frame so that your feet and knees are in line with the frame.  Use your hands for balance, not support, on the frame.  Slowly lower your weight, bending at the hips and knees. Keep your lower legs upright so that they are parallel with the door frame. Squat only within the range that does not increase your knee pain. Never let your hips drop below your knees.  Slowly return upright, pushing with your legs, not pulling with your hands. Repeat __________ times. Complete this exercise __________ times per day.  STRENGTH - Quad/VMO, Isometric   Sit in a chair with your right / left knee slightly bent. With your fingertips, feel the VMO muscle just above the inside of your knee. The VMO is important in controlling the position of your kneecap.  Keeping your fingertips on this muscle. Without actually moving your leg, attempt to drive your knee down as if straightening your leg. You should feel your VMO tense. If you have a difficult time, you may wish to try the same exercise on your healthy knee  first.  Tense this muscle as hard as you can without increasing any knee pain.  Hold for __________ seconds. Relax the muscles slowly and completely in between each repetition. Repeat __________ times. Complete exercise __________ times per day.    This information is not intended to replace advice given to you by your health care provider. Make sure you discuss any questions you have with your health care provider.   Document Released: 02/24/1998 Document Revised: 06/27/2014 Document Reviewed: 05/25/2008 Elsevier Interactive Patient Education [employer]

## 2016-02-20 ENCOUNTER — Encounter: Payer: Self-pay | Admitting: *Deleted

## 2016-02-20 ENCOUNTER — Emergency Department (INDEPENDENT_AMBULATORY_CARE_PROVIDER_SITE_OTHER): Payer: Self-pay

## 2016-02-20 ENCOUNTER — Emergency Department
Admission: EM | Admit: 2016-02-20 | Discharge: 2016-02-20 | Disposition: A | Discharge status: Home / Self Care | Payer: Self-pay | Source: Home / Self Care | Attending: Family Medicine | Admitting: Family Medicine

## 2016-02-20 DIAGNOSIS — R05 Cough: Secondary | ICD-10-CM

## 2016-02-20 DIAGNOSIS — J069 Acute upper respiratory infection, unspecified: Secondary | ICD-10-CM

## 2016-02-20 DIAGNOSIS — B9789 Other viral agents as the cause of diseases classified elsewhere: Secondary | ICD-10-CM

## 2016-02-20 DIAGNOSIS — J9801 Acute bronchospasm: Secondary | ICD-10-CM

## 2016-02-20 DIAGNOSIS — R062 Wheezing: Secondary | ICD-10-CM

## 2016-02-20 MED ORDER — IPRATROPIUM-ALBUTEROL 0.5-2.5 (3) MG/3ML IN SOLN
3.0000 mL | Freq: Once | RESPIRATORY_TRACT | Status: AC
  Administered 2016-02-20: 3 mL via RESPIRATORY_TRACT

## 2016-02-20 MED ORDER — DOXYCYCLINE HYCLATE 100 MG PO CAPS: 100.0000 mg | ORAL_CAPSULE | Freq: Two times a day (BID) | ORAL | 0 refills | Status: DC

## 2016-02-20 MED ORDER — BENZONATATE 200 MG PO CAPS: ORAL_CAPSULE | ORAL | 0 refills | Status: DC

## 2016-02-20 MED ORDER — METHYLPREDNISOLONE SODIUM SUCC 125 MG IJ SOLR
80.0000 mg | Freq: Once | INTRAMUSCULAR | Status: AC
  Administered 2016-02-20: 80 mg via INTRAMUSCULAR

## 2016-02-20 MED ORDER — PREDNISONE 20 MG PO TABS: ORAL_TABLET | ORAL | 0 refills | Status: DC

## 2016-02-20 NOTE — ED Triage Notes (Signed)
Pt c/o nasal congestion, productive clear cough, and difficulty breathing x 02/11/16. Denies fever. She has taken Mucinex.

## 2016-02-20 NOTE — Discharge Instructions (Signed)
Begin prednisone Thursday 02/21/16 Take plain guaifenesin (1200mg  extended release tabs such as Mucinex) twice daily, with plenty of water, for cough and congestion.  Get adequate rest.   May use Afrin nasal spray (or generic oxymetazoline) twice daily for about 5 days and then discontinue.  Also recommend using saline nasal spray several times daily and saline nasal irrigation (AYR is a common brand).  Use Flonase nasal spray each morning after using Afrin nasal spray and saline nasal irrigation. Try warm salt water gargles for sore throat.  Stop all antihistamines for now, and other non-prescription cough/cold preparations.

## 2016-02-20 NOTE — ED Provider Notes (Signed)
[name]    CSN: [id number] Arrival date & time: 02/20/16  1642     History   Chief Complaint Chief Complaint  Patient presents with  . Cough    HPI Tracy Carr is a 40 y.o. female.   About 10 days ago patient developed typical cold-like symptoms developing over several days, including mild sore throat, sinus congestion, headache, fatigue, and cough.  Her cough is somewhat productive of clear sputum, and she has developed tightness in her anterior chest with difficulty breathing.  No fevers, chills, and sweats.  She had asthma as a child.   The history is provided by the patient.    Past Medical History:  Diagnosis Date  . Depression   . Thyroid disease     Patient Active Problem List   Diagnosis Date Noted  . Left knee injury 01/07/2016  . DISTURBANCE OF SKIN SENSATION 04/21/2008  . HYPOTHYROIDISM 02/19/2008  . ANXIETY 02/19/2008    Past Surgical History:  Procedure Laterality Date  . APPENDECTOMY    . cesarean    . HERNIA REPAIR      OB History    No data available       Home Medications    Prior to Admission medications   Medication Sig Start Date End Date Taking? Authorizing Provider  albuterol (PROVENTIL HFA;VENTOLIN HFA) 108 (90 BASE) MCG/ACT inhaler Inhale 2 puffs into the lungs every 4 (four) hours as needed for wheezing. 02/21/14   Elson Areas, PA-C  benzonatate (TESSALON) 200 MG capsule Take one cap by mouth at bedtime as needed for cough.  May repeat in 4 to 6 hours 02/20/16   Lattie Haw, MD  diclofenac sodium (VOLTAREN) 1 % GEL Apply 4 g topically 4 (four) times daily. To affected joint. 01/07/16   Rodolph Bong, MD  doxycycline (VIBRAMYCIN) 100 MG capsule Take 1 capsule (100 mg total) by mouth 2 (two) times daily. Take with food. 02/20/16   Lattie Haw, MD  fluticasone Aleda Grana) 50 MCG/ACT nasal spray 1 or 2 sprays each nostril twice a day 06/05/13   Lajean Manes, MD  HYDROcodone-acetaminophen (NORCO/VICODIN)  5-325 MG tablet Take one by mouth at bedtime as needed for pain 01/04/16   Lattie Haw, MD  levonorgestrel (MIRENA) 20 MCG/24HR IUD 1 each by Intrauterine route once.      Historical Provider, MD  levothyroxine (SYNTHROID, LEVOTHROID) 200 MCG tablet Take 275 mcg by mouth daily.      Historical Provider, MD  PARoxetine (PAXIL) 40 MG tablet Take 40 mg by mouth every morning.      Historical Provider, MD  predniSONE (DELTASONE) 20 MG tablet Take one tab by mouth twice daily for 4 days, then one daily for 3 days. Take with food. 02/20/16   Lattie Haw, MD    Family History Family History  Problem Relation Age of Onset  . Diabetes Mother   . Cancer Father     Lung    Social History Social History  Substance Use Topics  . Smoking status: Current Every Day Smoker    Packs/day: 1.00    Years: 25.00    Types: Cigarettes  . Smokeless tobacco: Never Used  . Alcohol use 0.6 oz/week    1 Shots of liquor per week     Allergies   Amoxicillin; Penicillins; and Sulfonamide derivatives   Review of Systems Review of Systems  + sore throat + cough No pleuritic pain, but has tightness in anterior chest.  No wheezing + nasal congestion + post-nasal drainage No sinus pain/pressure No itchy/red eyes No earache No hemoptysis + SOB No fever/chills No nausea No vomiting No abdominal pain No diarrhea No urinary symptoms No skin rash + fatigue No myalgias + headache Used OTC meds without relief    Physical Exam Triage Vital Signs ED Triage Vitals  Enc Vitals Group     BP 02/20/16 1750 168/97     Pulse Rate 02/20/16 1750 78     Resp 02/20/16 1750 18     Temp 02/20/16 1750 98.3 F (36.8 C)     Temp Source 02/20/16 1750 Oral     SpO2 02/20/16 1750 96 %     Weight 02/20/16 1751 284 lb (128.8 kg)     Height 02/20/16 1751  (1.702 m)     Head Circumference --      Peak Flow --      Pain Score 02/20/16 1751 0     Pain Loc --      Pain Edu? --      Excl. in GC? --      No data found.   Updated Vital Signs BP 168/97 (BP Location: Left Arm)   Pulse 78   Temp 98.3 F (36.8 C) (Oral)   Resp 18   Ht  (1.702 m)   Wt 284 lb (128.8 kg)   LMP  (LMP Unknown)   SpO2 96%   BMI 44.48 kg/m   Visual Acuity Right Eye Distance:   Left Eye Distance:   Bilateral Distance:    Right Eye Near:   Left Eye Near:    Bilateral Near:     Physical Exam Nursing notes and Vital Signs reviewed. Appearance:  Patient appears stated age, and in no acute distress Eyes:  Pupils are equal, round, and reactive to light and accomodation.  Extraocular movement is intact.  Conjunctivae are not inflamed  Ears:  Canals normal.  Tympanic membranes normal.  Nose:  Mildly congested turbinates.  No sinus tenderness.     Pharynx:  Normal  Neck:  Supple.  Tender enlarged posterior/lateral nodes are palpated bilaterally  Lungs:  Course breath sounds diffusely.  Breath sounds are equal.  Moving air well.  Post neb treatment, breath sounds improved. Heart:  Regular rate and rhythm without murmurs, rubs, or gallops.  Abdomen:  Nontender without masses or hepatosplenomegaly.  Bowel sounds are present.  No CVA or flank tenderness.  Extremities:  No edema.  Skin:  No rash present.    UC Treatments / Results  Labs (all labs ordered are listed, but only abnormal results are displayed) Labs Reviewed - No data to display  EKG  EKG Interpretation None       Radiology Dg Chest 2 View  Result Date: 02/20/2016 CLINICAL DATA:  Wheezing and cough EXAM: CHEST  2 VIEW COMPARISON:  02/27/2014 FINDINGS: Normal cardiac silhouette. There is fine interstitial pattern. No focal consolidation. No pneumothorax or pleural fluid. No osseous abnormality. IMPRESSION: Fine interstitial pattern suggests bronchiolitis. No evidence pneumonia. Electronically Signed   By: Genevive Bi M.D.   On: 02/20/2016 18:10    Procedures Procedures (including critical care time)  Medications Ordered in  UC Medications  methylPREDNISolone sodium succinate (SOLU-MEDROL) 125 mg/2 mL injection 80 mg (not administered)  ipratropium-albuterol (DUONEB) 0.5-2.5 (3) MG/3ML nebulizer solution 3 mL (3 mLs Nebulization Given 02/20/16 1844)     Initial Impression / Assessment and Plan / UC Course  I have reviewed the triage  vital signs and the nursing notes.  Pertinent labs & imaging results that were available during my care of the patient were reviewed by me and considered in my medical decision making (see chart for details).  Clinical Course   Administered Solumedrol 80mg  IM Administered DuoNeb by hand held nebulizer  Begin doxycycline 100mg  BID for atypical coverage. Prescription written for Benzonatate [facility]) to take at bedtime for night-time cough. Begin prednisone burst/taper tomorrow.  Take plain guaifenesin (1200mg  extended release tabs such as Mucinex) twice daily, with plenty of water, for cough and congestion.  Get adequate rest.   May use Afrin nasal spray (or generic oxymetazoline) twice daily for about 5 days and then discontinue.  Also recommend using saline nasal spray several times daily and saline nasal irrigation (AYR is a common brand).  Use Flonase nasal spray each morning after using Afrin nasal spray and saline nasal irrigation. Try warm salt water gargles for sore throat.  Stop all antihistamines for now, and other non-prescription cough/cold preparations.     Final Clinical Impressions(s) / UC Diagnoses   Final diagnoses:  Viral URI with cough  Bronchospasm    New Prescriptions New Prescriptions   BENZONATATE (TESSALON) 200 MG CAPSULE    Take one cap by mouth at bedtime as needed for cough.  May repeat in 4 to 6 hours   DOXYCYCLINE (VIBRAMYCIN) 100 MG CAPSULE    Take 1 capsule (100 mg total) by mouth 2 (two) times daily. Take with food.   PREDNISONE (DELTASONE) 20 MG TABLET    Take one tab by mouth twice daily for 4 days, then one daily for 3 days. Take with  food.     Lattie Haw, MD 03/04/16 [phone]

## 2016-02-27 ENCOUNTER — Telehealth: Payer: Self-pay | Admitting: *Deleted

## 2016-02-27 NOTE — Telephone Encounter (Signed)
Pt called reports that she is no better from her visit on 02/20/16. Advised her to f/u with PCP or here [facility]evaluation [facility]. Pt agrees.

## 2016-08-15 ENCOUNTER — Encounter: Payer: Self-pay | Admitting: Emergency Medicine

## 2016-08-15 ENCOUNTER — Emergency Department
Admission: EM | Admit: 2016-08-15 | Discharge: 2016-08-15 | Disposition: A | Discharge status: Home / Self Care | Payer: Self-pay | Source: Home / Self Care | Attending: Family Medicine | Admitting: Family Medicine

## 2016-08-15 ENCOUNTER — Emergency Department (INDEPENDENT_AMBULATORY_CARE_PROVIDER_SITE_OTHER): Payer: Self-pay

## 2016-08-15 DIAGNOSIS — R05 Cough: Secondary | ICD-10-CM

## 2016-08-15 DIAGNOSIS — J209 Acute bronchitis, unspecified: Secondary | ICD-10-CM

## 2016-08-15 MED ORDER — PREDNISONE 20 MG PO TABS: ORAL_TABLET | ORAL | 0 refills | Status: DC

## 2016-08-15 MED ORDER — DOXYCYCLINE HYCLATE 100 MG PO CAPS: 100.0000 mg | ORAL_CAPSULE | Freq: Two times a day (BID) | ORAL | 0 refills | Status: DC

## 2016-08-15 MED ORDER — GUAIFENESIN-CODEINE 100-10 MG/5ML PO SOLN: ORAL | 0 refills | Status: DC

## 2016-08-15 MED ORDER — METHYLPREDNISOLONE SODIUM SUCC 125 MG IJ SOLR
80.0000 mg | Freq: Once | INTRAMUSCULAR | Status: AC
  Administered 2016-08-15: 80 mg via INTRAMUSCULAR

## 2016-08-15 NOTE — Discharge Instructions (Signed)
Begin prednisone Saturday, August 16, 2016 Continue plain guaifenesin (1200mg  extended release tabs such as Mucinex) twice daily, with plenty of water, for cough and congestion.  May add Pseudoephedrine (30mg , one or two every 4 to 6 hours) for sinus congestion.  Get adequate rest.   May use Afrin nasal spray (or generic oxymetazoline) each morning for about 5 days and then discontinue.  Also recommend using saline nasal spray several times daily and saline nasal irrigation (AYR is a common brand).  Use Flonase nasal spray each morning after using Afrin nasal spray and saline nasal irrigation. Try warm salt water gargles for sore throat.  Stop all antihistamines for now, and other non-prescription cough/cold preparations.   Follow-up with family doctor if not improving about10 days.

## 2016-08-15 NOTE — ED Triage Notes (Signed)
Patient presents to [facility] with a complaint of Cough and Congestion x 3 days. Patient reports that she has increased pressure in her head as well. Denies fever. Patient reports she has tried OTC medications with no help.

## 2016-08-15 NOTE — ED Provider Notes (Signed)
[facility]    CSN: [id number] Arrival date & time: 08/15/16  1636     History   Chief Complaint Chief Complaint  Patient presents with  . Cough    HPI [name] is a 41 y.o. female.   Patient complains of three day history of sinus congestion, headache, fatigue, and cough.  She has developed wheezing and shortness of breath with activity, and complains of pleuritic pain in her left lateral chest.  No fever, but she has had chills/sweats.  She has been using her albuterol inhaler more frequently.  She continues to smoke.   The history is provided by the patient.    Past Medical History:  Diagnosis Date  . Depression   . Thyroid disease     Patient Active Problem List   Diagnosis Date Noted  . Left knee injury 01/07/2016  . DISTURBANCE OF SKIN SENSATION 04/21/2008  . HYPOTHYROIDISM 02/19/2008  . ANXIETY 02/19/2008    Past Surgical History:  Procedure Laterality Date  . APPENDECTOMY    . cesarean    . HERNIA REPAIR      OB History    No data available       Home Medications    Prior to Admission medications   Medication Sig Start Date End Date Taking? Authorizing Provider  buPROPion (WELLBUTRIN XL) 150 MG 24 hr tablet Take 150 mg by mouth daily.   Yes [provider]  levothyroxine (SYNTHROID, LEVOTHROID) 200 MCG tablet Take 275 mcg by mouth daily.     Yes [provider]  PARoxetine (PAXIL) 40 MG tablet Take 40 mg by mouth every morning.     Yes [provider]  doxycycline (VIBRAMYCIN) 100 MG capsule Take 1 capsule (100 mg total) by mouth 2 (two) times daily. Take with food. 08/15/16   Lattie Haw, MD  guaiFENesin-codeine 100-10 MG/5ML syrup Take 10mL by mouth at bedtime as needed for cough 08/15/16   Lattie Haw, MD  predniSONE (DELTASONE) 20 MG tablet Take one tab by mouth twice daily for 4 days, then one daily for 3 days. Take with food. 08/15/16   Lattie Haw, MD    Family History Family  History  Problem Relation Age of Onset  . Diabetes Mother   . Cancer Father        Lung    Social History Social History  Substance Use Topics  . Smoking status: Current Every Day Smoker    Packs/day: 1.00    Years: 25.00    Types: Cigarettes  . Smokeless tobacco: Never Used  . Alcohol use 0.6 oz/week    1 Shots of liquor per week     Allergies   Amoxicillin; Penicillins; and Sulfonamide derivatives   Review of Systems Review of Systems No sore throat + cough + pleuritic pain + wheezing + nasal congestion + post-nasal drainage + sinus pain/pressure No itchy/red eyes No earache No hemoptysis + SOB No fever, + chills/sweats No nausea No vomiting No abdominal pain No diarrhea No urinary symptoms No skin rash + fatigue No myalgias + headache Used OTC meds without relief   Physical Exam Triage Vital Signs ED Triage Vitals  Enc Vitals Group     BP 08/15/16 1719 117/81     Pulse Rate 08/15/16 1719 75     Resp --      Temp 08/15/16 1719 98.4 F (36.9 C)     Temp Source 08/15/16 1719 Oral     SpO2 08/15/16  1719 97 %     Weight 08/15/16 1720 285 lb 8 oz (129.5 kg)     Height 08/15/16 1720 5\' 7"  (1.702 m)     Head Circumference --      Peak Flow --      Pain Score 08/15/16 1720 5     Pain Loc --      Pain Edu? --      Excl. in GC? --    No data found.   Updated Vital Signs BP 117/81 (BP Location: Right Arm)   Pulse 75   Temp 98.4 F (36.9 C) (Oral)   Ht 5\' 7"  (1.702 m)   Wt 285 lb 8 oz (129.5 kg)   SpO2 97%   BMI 44.72 kg/m   Visual Acuity Right Eye Distance:   Left Eye Distance:   Bilateral Distance:    Right Eye Near:   Left Eye Near:    Bilateral Near:     Physical Exam Nursing notes and Vital Signs reviewed. Appearance:  Patient appears stated age, and in no acute distress Eyes:  Pupils are equal, round, and reactive to light and accomodation.  Extraocular movement is intact.  Conjunctivae are not inflamed  Ears:  Canals normal.   Tympanic membranes normal.  Nose:  Mildly congested turbinates.  No sinus tenderness.    Pharynx:  Normal Neck:  Supple.  Enlarged posterior/lateral nodes are palpated bilaterally, tender to palpation on the left.   Lungs:   Bilateral rhonchi heard posterior chesst.  Breath sounds are equal.  Moving air well. Heart:  Regular rate and rhythm without murmurs, rubs, or gallops.  Abdomen:  Nontender without masses or hepatosplenomegaly.  Bowel sounds are present.  No CVA or flank tenderness.  Extremities:  No edema.  Skin:  No rash present.    UC Treatments / Results  Labs (all labs ordered are listed, but only abnormal results are displayed) Labs Reviewed - No data to display  EKG  EKG Interpretation None       Radiology Dg Chest 2 View  Result Date: 08/15/2016 CLINICAL DATA:  Cough and congestion EXAM: CHEST  2 VIEW COMPARISON:  None. FINDINGS: Normal mediastinum and cardiac silhouette. Chronic central bronchitic markings. Normal pulmonary vasculature. No effusion, infiltrate, or pneumothorax. IMPRESSION: Bronchitic change similar prior.  No acute findings. Electronically Signed   By: Genevive Bi M.D.   On: 08/15/2016 18:09    Procedures Procedures (including critical care time)  Medications Ordered in UC Medications  methylPREDNISolone sodium succinate (SOLU-MEDROL) 125 mg/2 mL injection 80 mg (not administered)     Initial Impression / Assessment and Plan / UC Course  I have reviewed the triage vital signs and the nursing notes.  Pertinent labs & imaging results that were available during my care of the patient were reviewed by me and considered in my medical decision making (see chart for details).    Administered Solumedrol 80mg  IM. Begin doxycycline 100mg  BID. Rx for Robitussin AC for night time cough.  Begin prednisone burst/taper Saturday, August 16, 2016 Continue plain guaifenesin (1200mg  extended release tabs such as Mucinex) twice daily, with plenty of  water, for cough and congestion.  May add Pseudoephedrine (30mg , one or two every 4 to 6 hours) for sinus congestion.  Get adequate rest.   May use Afrin nasal spray (or generic oxymetazoline) each morning for about 5 days and then discontinue.  Also recommend using saline nasal spray several times daily and saline nasal irrigation (AYR is a common  brand).  Use Flonase nasal spray each morning after using Afrin nasal spray and saline nasal irrigation. Try warm salt water gargles for sore throat.  Stop all antihistamines for now, and other non-prescription cough/cold preparations.   Follow-up with family doctor if not improving about10 days.     Final Clinical Impressions(s) / UC Diagnoses   Final diagnoses:  Acute bronchitis, unspecified organism    New Prescriptions New Prescriptions   DOXYCYCLINE (VIBRAMYCIN) 100 MG CAPSULE    Take 1 capsule (100 mg total) by mouth 2 (two) times daily. Take with food.   GUAIFENESIN-CODEINE 100-10 MG/5ML SYRUP    Take 10mL by mouth at bedtime as needed for cough   PREDNISONE (DELTASONE) 20 MG TABLET    Take one tab by mouth twice daily for 4 days, then one daily for 3 days. Take with food.     Lattie Haw, MD 08/17/16 [id number]

## 2016-08-22 ENCOUNTER — Telehealth: Payer: Self-pay | Admitting: *Deleted

## 2016-08-22 NOTE — Telephone Encounter (Signed)
Please advise pt that coughs can linger for up to 3-4 weeks after treatment, however, if she has a persistent fever >100.4*F, worsening chest pain or difficulty breathing, she should come in for a recheck.  CXR from last visit showed signs c/w COPD which could mean the cough can become chronic. Encourage to follow up with Primary Care.  Discourage smoking or being around second-hand smoke which can exacerbate cough.

## 2016-08-22 NOTE — Telephone Encounter (Signed)
Patient called reporting she is finishing up her antibiotic and prednisone and has not shown any improvement. Please advise.

## 2016-08-22 NOTE — Telephone Encounter (Signed)
LMOM advising patient of Erin's instructions below.

## 2017-07-30 ENCOUNTER — Encounter: Payer: Self-pay | Admitting: Emergency Medicine

## 2017-07-30 ENCOUNTER — Other Ambulatory Visit: Payer: Self-pay

## 2017-07-30 ENCOUNTER — Emergency Department
Admission: EM | Admit: 2017-07-30 | Discharge: 2017-07-30 | Disposition: A | Discharge status: Home / Self Care | Payer: Self-pay | Source: Home / Self Care | Attending: Family Medicine | Admitting: Family Medicine

## 2017-07-30 DIAGNOSIS — J441 Chronic obstructive pulmonary disease with (acute) exacerbation: Secondary | ICD-10-CM

## 2017-07-30 DIAGNOSIS — F17200 Nicotine dependence, unspecified, uncomplicated: Secondary | ICD-10-CM

## 2017-07-30 DIAGNOSIS — J209 Acute bronchitis, unspecified: Secondary | ICD-10-CM

## 2017-07-30 MED ORDER — AZITHROMYCIN 250 MG PO TABS: 250.0000 mg | ORAL_TABLET | Freq: Every day | ORAL | 0 refills | Status: DC

## 2017-07-30 MED ORDER — PREDNISONE 20 MG PO TABS: ORAL_TABLET | ORAL | 0 refills | Status: DC

## 2017-07-30 MED ORDER — METHYLPREDNISOLONE SODIUM SUCC 40 MG IJ SOLR
80.0000 mg | Freq: Once | INTRAMUSCULAR | Status: AC
  Administered 2017-07-30: 80 mg via INTRAMUSCULAR

## 2017-07-30 MED ORDER — ALBUTEROL SULFATE HFA 108 (90 BASE) MCG/ACT IN AERS: 1.0000 | INHALATION_SPRAY | Freq: Four times a day (QID) | RESPIRATORY_TRACT | 0 refills | Status: DC | PRN

## 2017-07-30 MED ORDER — IPRATROPIUM-ALBUTEROL 0.5-2.5 (3) MG/3ML IN SOLN
3.0000 mL | Freq: Once | RESPIRATORY_TRACT | Status: AC
  Administered 2017-07-30: 3 mL via RESPIRATORY_TRACT

## 2017-07-30 NOTE — ED Provider Notes (Signed)
[name]    CSN: [id number] Arrival date & time: 07/30/17  1816     History   Chief Complaint Chief Complaint  Patient presents with  . Cough  . Nasal Congestion    HPI Tracy Carr is a 42 y.o. female.   HPI  Tracy Carr is a 42 y.o. female presenting to UC with c/o 3 weeks of gradually worsening productive cough with chest tightness and mild SOB. Pt notes she is a "heavy smoker" and typically needs a steroid shot and breathing treatment when she gets this bad.  Denies fever, chills, n/v/d.    Past Medical History:  Diagnosis Date  . Depression   . Thyroid disease     Patient Active Problem List   Diagnosis Date Noted  . Left knee injury 01/07/2016  . DISTURBANCE OF SKIN SENSATION 04/21/2008  . HYPOTHYROIDISM 02/19/2008  . ANXIETY 02/19/2008    Past Surgical History:  Procedure Laterality Date  . APPENDECTOMY    . cesarean    . HERNIA REPAIR      OB History   None      Home Medications    Prior to Admission medications   Medication Sig Start Date End Date Taking? Authorizing Provider  albuterol (PROVENTIL HFA;VENTOLIN HFA) 108 (90 Base) MCG/ACT inhaler Inhale 1-2 puffs into the lungs every 6 (six) hours as needed for wheezing or shortness of breath. 07/30/17   Lurene Shadow, PA-C  azithromycin (ZITHROMAX) 250 MG tablet Take 1 tablet (250 mg total) by mouth daily. Take first 2 tablets together, then 1 every day until finished. 07/30/17   Lurene Shadow, PA-C  buPROPion (WELLBUTRIN XL) 150 MG 24 hr tablet Take 150 mg by mouth daily.    [provider]  doxycycline (VIBRAMYCIN) 100 MG capsule Take 1 capsule (100 mg total) by mouth 2 (two) times daily. Take with food. 08/15/16   Lattie Haw, MD  guaiFENesin-codeine 100-10 MG/5ML syrup Take 10mL by mouth at bedtime as needed for cough 08/15/16   Lattie Haw, MD  levothyroxine (SYNTHROID, LEVOTHROID) 200 MCG tablet Take 275 mcg by mouth daily.      [provider]    PARoxetine (PAXIL) 40 MG tablet Take 40 mg by mouth every morning.      [provider]  predniSONE (DELTASONE) 20 MG tablet 3 tabs po day one, then 2 po daily x 4 days 07/30/17   Lurene Shadow, PA-C    Family History Family History  Problem Relation Age of Onset  . Diabetes Mother   . Cancer Father        Lung    Social History Social History   Tobacco Use  . Smoking status: Current Every Day Smoker    Packs/day: 1.00    Years: 25.00    Pack years: 25.00    Types: Cigarettes  . Smokeless tobacco: Never Used  Substance Use Topics  . Alcohol use: Yes    Alcohol/week: 0.6 oz    Types: 1 Shots of liquor per week  . Drug use: No     Allergies   Amoxicillin; Penicillins; and Sulfonamide derivatives   Review of Systems Review of Systems  Constitutional: Negative for chills and fever.  HENT: Positive for congestion and sore throat. Negative for ear pain, trouble swallowing and voice change.   Respiratory: Positive for cough, chest tightness, shortness of breath and wheezing.   Cardiovascular: Negative for chest pain and palpitations.  Gastrointestinal: Negative for abdominal  pain, diarrhea, nausea and vomiting.  Musculoskeletal: Negative for arthralgias, back pain and myalgias.  Skin: Negative for rash.     Physical Exam Triage Vital Signs ED Triage Vitals [07/30/17 1843]  Enc Vitals Group     BP 121/73     Pulse Rate 68     Resp      Temp 97.8 F (36.6 C)     Temp Source Oral     SpO2 99 %     Weight 268 lb (121.6 kg)     Height 5\' 7"  (1.702 m)     Head Circumference      Peak Flow      Pain Score 8     Pain Loc      Pain Edu?      Excl. in GC?    No data found.  Updated Vital Signs BP 121/73 (BP Location: Left Arm)   Pulse 68   Temp 97.8 F (36.6 C) (Oral)   Ht 5\' 7"  (1.702 m)   Wt 268 lb (121.6 kg)   SpO2 99%   BMI 41.97 kg/m   Visual Acuity Right Eye Distance:   Left Eye Distance:   Bilateral Distance:    Right Eye Near:    Left Eye Near:    Bilateral Near:     Physical Exam  Constitutional: She is oriented to person, place, and time. She appears well-developed and well-nourished. No distress.  HENT:  Head: Normocephalic and atraumatic.  Right Ear: Tympanic membrane normal.  Left Ear: Tympanic membrane normal.  Nose: Nose normal. Right sinus exhibits no maxillary sinus tenderness and no frontal sinus tenderness. Left sinus exhibits no maxillary sinus tenderness and no frontal sinus tenderness.  Mouth/Throat: Uvula is midline, oropharynx is clear and moist and mucous membranes are normal.  Eyes: EOM are normal.  Neck: Normal range of motion. Neck supple.  Cardiovascular: Normal rate and regular rhythm.  Pulmonary/Chest: Effort normal. No stridor. No respiratory distress. She has no decreased breath sounds. She has wheezes. She has rhonchi.  Diffuse wheeze and rhonchi. No respiratory distress.  Musculoskeletal: Normal range of motion.  Neurological: She is alert and oriented to person, place, and time.  Skin: Skin is warm and dry. She is not diaphoretic.  Psychiatric: She has a normal mood and affect. Her behavior is normal.  Nursing note and vitals reviewed.    UC Treatments / Results  Labs (all labs ordered are listed, but only abnormal results are displayed) Labs Reviewed - No data to display  EKG None  Radiology No results found.  Procedures Procedures (including critical care time)  Medications Ordered in UC Medications  ipratropium-albuterol (DUONEB) 0.5-2.5 (3) MG/3ML nebulizer solution 3 mL (3 mLs Nebulization Given 07/30/17 1901)  methylPREDNISolone sodium succinate (SOLU-MEDROL) 40 mg/mL injection 80 mg (80 mg Intramuscular Given 07/30/17 1901)    Initial Impression / Assessment and Plan / UC Course  I have reviewed the triage vital signs and the nursing notes.  Pertinent labs & imaging results that were available during my care of the patient were reviewed by me and considered in my  medical decision making (see chart for details).     Hx and exam c/w acute bronchitis and COPD exacerbation. Due to duration of worsening symptoms, will cover for underlying bacterial cause. Pt given Solumedrol 80mg  IM and duoneb. Lung sound after tx: improved significantly, mild rhonchi. No more wheeze.  Home care instructions provided below.    Final Clinical Impressions(s) / UC Diagnoses  Final diagnoses:  Acute bronchitis, unspecified organism  COPD exacerbation (HCC)  Current every day smoker     Discharge Instructions      You may take  acetaminophen every 4-6 hours or in combination with ibuprofen 400-600mg  every 6-8 hours as needed for pain, inflammation, and fever.  Be sure to drink at least eight 8oz glasses of water to stay well hydrated and get at least 8 hours of sleep at night, preferably more while sick.   Please take antibiotics as prescribed and be sure to complete entire course even if you start to feel better to ensure infection does not come back.  Please follow up with family medicine in 7-10 days if not improving, sooner if significantly worsening. Please call 911 or go to the hospital if breathing continues to worsen despite home treatment.     ED Prescriptions    Medication Sig Dispense Auth. Provider   azithromycin (ZITHROMAX) 250 MG tablet Take 1 tablet (250 mg total) by mouth daily. Take first 2 tablets together, then 1 every day until finished. 6 tablet Doroteo Glassman, Fabiola Mudgett O, PA-C   predniSONE (DELTASONE) 20 MG tablet 3 tabs po day one, then 2 po daily x 4 days 11 tablet Kathaleen Dudziak O, PA-C   albuterol (PROVENTIL HFA;VENTOLIN HFA) 108 (90 Base) MCG/ACT inhaler Inhale 1-2 puffs into the lungs every 6 (six) hours as needed for wheezing or shortness of breath. 1 Inhaler Lurene Shadow, PA-C     Controlled Substance Prescriptions [place] Controlled Substance Registry consulted? Not Applicable   Rolla Plate 07/30/17 1610

## 2017-07-30 NOTE — ED Triage Notes (Signed)
42 y.o female presents to the clinic c/o of cough and congestion. She states this has been going on for 3 weeks. She denies prior tx but states this happens frequently.

## 2017-07-30 NOTE — Discharge Instructions (Signed)
°  You may take 500mg  acetaminophen every 4-6 hours or in combination with ibuprofen 400-600mg  every 6-8 hours as needed for pain, inflammation, and fever.  Be sure to drink at least eight 8oz glasses of water to stay well hydrated and get at least 8 hours of sleep at night, preferably more while sick.   Please take antibiotics as prescribed and be sure to complete entire course even if you start to feel better to ensure infection does not come back.  Please follow up with family medicine in 7-10 days if not improving, sooner if significantly worsening. Please call 911 or go to the hospital if breathing continues to worsen despite home treatment.

## 2017-07-30 NOTE — ED Notes (Signed)
Injection given in [facility] of solu-medrol, pt tolerated well.   ND[facility]- [id number] LOT- [id number] EXP DATE- 11/2018

## 2017-08-05 ENCOUNTER — Telehealth: Payer: Self-pay | Admitting: *Deleted

## 2017-08-05 NOTE — Telephone Encounter (Signed)
Pt called reports that she is no better after taking Prednisone and ABT. Advised her she should return to the clinic for reevaluation. Pt verbalized understanding.

## 2017-08-06 ENCOUNTER — Emergency Department (INDEPENDENT_AMBULATORY_CARE_PROVIDER_SITE_OTHER): Payer: Self-pay

## 2017-08-06 ENCOUNTER — Other Ambulatory Visit: Payer: Self-pay

## 2017-08-06 ENCOUNTER — Emergency Department (INDEPENDENT_AMBULATORY_CARE_PROVIDER_SITE_OTHER)
Admission: EM | Admit: 2017-08-06 | Discharge: 2017-08-06 | Disposition: A | Discharge status: Home / Self Care | Payer: Self-pay | Source: Home / Self Care | Attending: Family Medicine | Admitting: Family Medicine

## 2017-08-06 DIAGNOSIS — R05 Cough: Secondary | ICD-10-CM

## 2017-08-06 DIAGNOSIS — J42 Unspecified chronic bronchitis: Secondary | ICD-10-CM

## 2017-08-06 DIAGNOSIS — J441 Chronic obstructive pulmonary disease with (acute) exacerbation: Secondary | ICD-10-CM

## 2017-08-06 MED ORDER — DOXYCYCLINE HYCLATE 100 MG PO CAPS: 100.0000 mg | ORAL_CAPSULE | Freq: Two times a day (BID) | ORAL | 0 refills | Status: DC

## 2017-08-06 MED ORDER — PREDNISONE 50 MG PO TABS
ORAL_TABLET | ORAL | 0 refills | Status: DC
Start: 2017-08-06 — End: 2019-09-19

## 2017-08-06 MED ORDER — BUDESONIDE-FORMOTEROL FUMARATE 80-4.5 MCG/ACT IN AERO: 2.0000 | INHALATION_SPRAY | Freq: Two times a day (BID) | RESPIRATORY_TRACT | 1 refills | Status: DC

## 2017-08-06 MED ORDER — IPRATROPIUM-ALBUTEROL 0.5-2.5 (3) MG/3ML IN SOLN
3.0000 mL | Freq: Once | RESPIRATORY_TRACT | Status: AC
  Administered 2017-08-06: 3 mL via RESPIRATORY_TRACT

## 2017-08-06 MED ORDER — METHYLPREDNISOLONE SODIUM SUCC 125 MG IJ SOLR
80.0000 mg | Freq: Once | INTRAMUSCULAR | Status: AC
  Administered 2017-08-06: 80 mg via INTRAMUSCULAR

## 2017-08-06 NOTE — ED Triage Notes (Signed)
Pt was here last Thursday.  Felt better after visit, then Monday or Tuesday felt worse.  Finished prednisone and azithromycin.  Now feels worse.

## 2017-08-06 NOTE — Discharge Instructions (Addendum)
Begin prednisone on Friday 08/07/17. Take plain guaifenesin (1200mg  extended release tabs such as Mucinex) twice daily, with plenty of water, for cough and congestion.    May continue albuterol inhaler as needed (decrease to two puffs each dose). Stop all antihistamines for now, and other non-prescription cough/cold preparations.

## 2017-08-06 NOTE — ED Provider Notes (Signed)
[name]    CSN: [id number] Arrival date & time: 08/06/17  1135     History   Chief Complaint Chief Complaint  Patient presents with  . Cough    HPI Tracy Carr is a 42 y.o. female.   Patient complains of persistent cough after being treated with azithromycin one week ago.  She complains of vague discomfort in her left upper anterior chest.  She also feels tight in her anterior chest. She has had sweats during the past four days.  She notes that her albuterol inhaler has been helpful.   She continues to smoke.  She has a past history of pneumonia about two years ago.  The history is provided by the patient.    Past Medical History:  Diagnosis Date  . Depression   . Thyroid disease     Patient Active Problem List   Diagnosis Date Noted  . Left knee injury 01/07/2016  . DISTURBANCE OF SKIN SENSATION 04/21/2008  . HYPOTHYROIDISM 02/19/2008  . ANXIETY 02/19/2008    Past Surgical History:  Procedure Laterality Date  . APPENDECTOMY    . cesarean    . HERNIA REPAIR      OB History   None      Home Medications    Prior to Admission medications   Medication Sig Start Date End Date Taking? Authorizing Provider  albuterol (PROVENTIL HFA;VENTOLIN HFA) 108 (90 Base) MCG/ACT inhaler Inhale 1-2 puffs into the lungs every 6 (six) hours as needed for wheezing or shortness of breath. 07/30/17   Lurene Shadow, PA-C  azithromycin (ZITHROMAX) 250 MG tablet Take 1 tablet (250 mg total) by mouth daily. Take first 2 tablets together, then 1 every day until finished. 07/30/17   Lurene Shadow, PA-C  budesonide-formoterol (SYMBICORT) 80-4.5 MCG/ACT inhaler Inhale 2 puffs into the lungs 2 (two) times daily. 08/06/17 09/05/17  Lattie Haw, MD  buPROPion (WELLBUTRIN XL) 150 MG 24 hr tablet Take 150 mg by mouth daily.    [provider]  doxycycline (VIBRAMYCIN) 100 MG capsule Take 1 capsule (100 mg total) by mouth 2 (two) times daily. Take with food.  08/06/17   Lattie Haw, MD  guaiFENesin-codeine 100-10 MG/5ML syrup Take 10mL by mouth at bedtime as needed for cough 08/15/16   Lattie Haw, MD  levothyroxine (SYNTHROID, LEVOTHROID) 200 MCG tablet Take 275 mcg by mouth daily.      [provider]  PARoxetine (PAXIL) 40 MG tablet Take 40 mg by mouth every morning.      [provider]  predniSONE (DELTASONE) 50 MG tablet Take one tab by mouth with food once daily for five days 08/06/17   Lattie Haw, MD    Family History Family History  Problem Relation Age of Onset  . Diabetes Mother   . Cancer Father        Lung    Social History Social History   Tobacco Use  . Smoking status: Current Every Day Smoker    Packs/day: 1.00    Years: 25.00    Pack years: 25.00    Types: Cigarettes  . Smokeless tobacco: Never Used  Substance Use Topics  . Alcohol use: Yes    Alcohol/week: 0.6 oz    Types: 1 Shots of liquor per week  . Drug use: No     Allergies   Amoxicillin; Penicillins; and Sulfonamide derivatives   Review of Systems Review of Systems No sore throat + cough No pleuritic pain  No wheezing + nasal congestion ? post-nasal drainage No sinus pain/pressure No itchy/red eyes No earache No hemoptysis + SOB No fever, + chills/sweats No nausea No vomiting No abdominal pain No diarrhea No urinary symptoms No skin rash + fatigue No myalgias No headache    Physical Exam Triage Vital Signs ED Triage Vitals  Enc Vitals Group     BP 08/06/17 1159 (!) 149/95     Pulse Rate 08/06/17 1159 66     Resp --      Temp 08/06/17 1159 98.3 F (36.8 C)     Temp Source 08/06/17 1159 Oral     SpO2 08/06/17 1159 97 %     Weight 08/06/17 1200 264 lb (119.7 kg)     Height 08/06/17 1200 5\' 7"  (1.702 m)     Head Circumference --      Peak Flow --      Pain Score 08/06/17 1159 2     Pain Loc --      Pain Edu? --      Excl. in GC? --    No data found.  Updated Vital Signs BP (!) 149/95 (BP  Location: Right Arm)   Pulse 66   Temp 98.3 F (36.8 C) (Oral)   Ht 5\' 7"  (1.702 m)   Wt 264 lb (119.7 kg)   SpO2 97%   BMI 41.35 kg/m   Visual Acuity Right Eye Distance:   Left Eye Distance:   Bilateral Distance:    Right Eye Near:   Left Eye Near:    Bilateral Near:     Physical Exam Nursing notes and Vital Signs reviewed. Appearance:  Patient appears stated age, and in no acute distress Eyes:  Pupils are equal, round, and reactive to light and accomodation.  Extraocular movement is intact.  Conjunctivae are not inflamed  Ears:  Canals normal.  Tympanic membranes normal.  Nose:  Mildly congested turbinates.  No sinus tenderness.  Pharynx:  Normal Neck:  Supple.  Enlarged posterior/lateral nodes are palpated bilaterally, tender to palpation on the left.   Lungs:  Bibasilar rhonchi posteriorly.  Breath sounds are equal.  Moving air well. Heart:  Regular rate and rhythm without murmurs, rubs, or gallops.  Abdomen:  Nontender without masses or hepatosplenomegaly.  Bowel sounds are present.  No CVA or flank tenderness.  Extremities:  No edema.  Skin:  No rash present.    UC Treatments / Results  Labs (all labs ordered are listed, but only abnormal results are displayed) Labs Reviewed - No data to display  EKG None  Radiology Dg Chest 2 View  Result Date: 08/06/2017 CLINICAL DATA:  Nonproductive cough for the past month. EXAM: CHEST - 2 VIEW COMPARISON:  Chest x-ray dated August 15, 2016. FINDINGS: The heart size and mediastinal contours are within normal limits. Normal pulmonary vascularity. Chronic bronchitic changes, similar to prior study. No focal consolidation, pleural effusion, or pneumothorax. No acute osseous abnormality. IMPRESSION: No active cardiopulmonary disease. Electronically Signed   By: Obie Dredge M.D.   On: 08/06/2017 12:36    Procedures Procedures (including critical care time)  Medications Ordered in UC Medications  methylPREDNISolone sodium  succinate (SOLU-MEDROL) 125 mg/2 mL injection 80 mg (has no administration in time range)  ipratropium-albuterol (DUONEB) 0.5-2.5 (3) MG/3ML nebulizer solution 3 mL (3 mLs Nebulization Given 08/06/17 1216)    Initial Impression / Assessment and Plan / UC Course  I have reviewed the triage vital signs and the nursing notes.  Pertinent labs &  imaging results that were available during my care of the patient were reviewed by me and considered in my medical decision making (see chart for details).    Administered DuoNeb by hand held nebulizer.  Patient notes improved respiration afterwards. Administered Solumedrol 80mg  IM. Begin doxycycline 100mg  BID Followup with pulmonologist if not improving about 2 weeks.   Final Clinical Impressions(s) / UC Diagnoses   Final diagnoses:  COPD exacerbation (HCC)  Chronic bronchitis, unspecified chronic bronchitis type [facility](HCC)     Discharge Instructions     Begin prednisone on Friday 08/07/17. Take plain guaifenesin (1200mg  extended release tabs such as Mucinex) twice daily, with plenty of water, for cough and congestion.    May continue albuterol inhaler as needed (decrease to two puffs each dose). Stop all antihistamines for now, and other non-prescription cough/cold preparations.      ED Prescriptions    Medication Sig Dispense Auth. Provider   predniSONE (DELTASONE) 50 MG tablet Take one tab by mouth with food once daily for five days 5 tablet Lattie HawBeese, Malcomb Gangemi A, MD   doxycycline (VIBRAMYCIN) 100 MG capsule Take 1 capsule (100 mg total) by mouth 2 (two) times daily. Take with food. 20 capsule Lattie HawBeese, Sudeep Scheibel A, MD   budesonide-formoterol (SYMBICORT) 80-4.5 MCG/ACT inhaler Inhale 2 puffs into the lungs 2 (two) times daily. 1 Inhaler Lattie HawBeese, Tenya Araque A, MD         Lattie HawBeese, Karina Nofsinger A, MD 08/06/17 [id number]

## 2019-08-21 ENCOUNTER — Other Ambulatory Visit: Payer: Self-pay

## 2019-08-21 ENCOUNTER — Emergency Department (INDEPENDENT_AMBULATORY_CARE_PROVIDER_SITE_OTHER)
Admission: EM | Admit: 2019-08-21 | Discharge: 2019-08-21 | Disposition: A | Discharge status: Home / Self Care | Payer: Medicaid Other | Source: Home / Self Care

## 2019-08-21 ENCOUNTER — Emergency Department (INDEPENDENT_AMBULATORY_CARE_PROVIDER_SITE_OTHER): Payer: Medicaid Other

## 2019-08-21 DIAGNOSIS — M79674 Pain in right toe(s): Secondary | ICD-10-CM

## 2019-08-21 DIAGNOSIS — S80811A Abrasion, right lower leg, initial encounter: Secondary | ICD-10-CM

## 2019-08-21 DIAGNOSIS — M79671 Pain in right foot: Secondary | ICD-10-CM

## 2019-08-21 DIAGNOSIS — S93511A Sprain of interphalangeal joint of right great toe, initial encounter: Secondary | ICD-10-CM
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
## 2019-08-21 NOTE — Discharge Instructions (Signed)
  You may take 500mg  acetaminophen every 4-6 hours or in combination with ibuprofen 400-600mg  every 6-8 hours as needed for pain and inflammation.  You may use crutches as needed to help keep weight off foot until pain subsides.   Call to schedule a follow up with Sports Medicine or family medicine in 1-2 weeks if not improving.
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
## 2019-09-14 ENCOUNTER — Telehealth: Payer: Self-pay | Admitting: Emergency Medicine
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
## 2019-09-19 ENCOUNTER — Other Ambulatory Visit: Payer: Self-pay

## 2019-09-19 ENCOUNTER — Ambulatory Visit (INDEPENDENT_AMBULATORY_CARE_PROVIDER_SITE_OTHER): Payer: Medicaid Other

## 2019-09-19 ENCOUNTER — Ambulatory Visit (INDEPENDENT_AMBULATORY_CARE_PROVIDER_SITE_OTHER): Payer: Medicaid Other | Admitting: Sports Medicine

## 2019-09-19 DIAGNOSIS — M2550 Pain in unspecified joint: Secondary | ICD-10-CM

## 2019-09-19 DIAGNOSIS — S92311A Displaced fracture of first metatarsal bone, right foot, initial encounter for closed fracture: Secondary | ICD-10-CM | POA: Insufficient documentation

## 2019-09-19 DIAGNOSIS — S99921A Unspecified injury of right foot, initial encounter: Secondary | ICD-10-CM | POA: Diagnosis not present

## 2019-09-19 DIAGNOSIS — S92314A Nondisplaced fracture of first metatarsal bone, right foot, initial encounter for closed fracture: Secondary | ICD-10-CM

## 2019-09-19 MED ORDER — MELOXICAM 15 MG PO TABS: ORAL_TABLET | ORAL | 3 refills | Status: DC
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
## 2019-09-19 NOTE — Assessment & Plan Note (Signed)
Ordering rheumatoid testing, significant morning stiffness, widespread aches and pains, strong family history of rheumatoid arthritis.
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
## 2019-09-22 LAB — LUPUS(12) PANEL
Anti Nuclear Antibody (ANA): NEGATIVE
C3 Complement: 123 mg/dL (ref 83–193)
C4 Complement: 28 mg/dL (ref 15–57)
ENA SM Ab Ser-aCnc: <1 AI
Rheumatoid fact SerPl-aCnc: <14 IU/mL (ref <14)
Ribosomal P Protein Ab: <1 AI
SM/RNP: <1 AI
SSA (Ro) (ENA) Antibody, IgG: <1 AI
SSB (La) (ENA) Antibody, IgG: <1 AI
Scleroderma (Scl-70) (ENA) Antibody, IgG: <1 AI
Thyroperoxidase Ab SerPl-aCnc: 36 IU/mL — ABNORMAL HIGH (ref <9)
ds DNA Ab: <1 IU/mL

## 2019-09-22 LAB — CBC WITH DIFFERENTIAL/PLATELET
Absolute Monocytes: 844 cells/uL (ref 200–950)
Basophils Absolute: 125 cells/uL (ref 0–200)
Basophils Relative: 1.1 %
Eosinophils Absolute: 171 cells/uL (ref 15–500)
Eosinophils Relative: 1.5 %
HCT: 41.9 % (ref 35.0–45.0)
Hemoglobin: 14.1 g/dL (ref 11.7–15.5)
Lymphs Abs: 3249 cells/uL (ref 850–3900)
MCH: 29.7 pg (ref 27.0–33.0)
MCHC: 33.7 g/dL (ref 32.0–36.0)
MCV: 88.2 fL (ref 80.0–100.0)
MPV: 11.4 fL (ref 7.5–12.5)
Monocytes Relative: 7.4 %
Neutro Abs: 7011 cells/uL (ref 1500–7800)
Neutrophils Relative %: 61.5 %
Platelets: 352 10*3/uL (ref 140–400)
RBC: 4.75 10*6/uL (ref 3.80–5.10)
RDW: 12.8 % (ref 11.0–15.0)
Total Lymphocyte: 28.5 %
WBC: 11.4 10*3/uL — ABNORMAL HIGH (ref 3.8–10.8)

## 2019-09-22 LAB — COMPREHENSIVE METABOLIC PANEL
AG Ratio: 1.6 (calc) (ref 1.0–2.5)
ALT: 14 U/L (ref 6–29)
AST: 16 U/L (ref 10–35)
Albumin: 3.9 g/dL (ref 3.6–5.1)
Alkaline phosphatase (APISO): 52 U/L (ref 31–125)
BUN: 11 mg/dL (ref 7–25)
CO2: 24 mmol/L (ref 20–32)
Calcium: 9.2 mg/dL (ref 8.6–10.2)
Chloride: 110 mmol/L (ref 98–110)
Creat: 0.92 mg/dL (ref 0.50–1.10)
Globulin: 2.5 g/dL (calc) (ref 1.9–3.7)
Glucose, Bld: 93 mg/dL (ref 65–99)
Potassium: 4.3 mmol/L (ref 3.5–5.3)
Sodium: 140 mmol/L (ref 135–146)
Total Bilirubin: 0.4 mg/dL (ref 0.2–1.2)
Total Protein: 6.4 g/dL (ref 6.1–8.1)

## 2019-09-22 LAB — SEDIMENTATION RATE: Sed Rate: 9 mm/h (ref 0–20)

## 2019-09-22 LAB — CYCLIC CITRUL PEPTIDE ANTIBODY, IGG: Cyclic Citrullin Peptide Ab: <16 UNITS

## 2019-09-22 LAB — URIC ACID: Uric Acid, Serum: 6.9 mg/dL (ref 2.5–7.0)

## 2019-09-22 LAB — RHEUMATOID FACTOR (IGA, IGG, IGM)
Rheumatoid Factor (IgA): <5 U (ref <=6)
Rheumatoid Factor (IgG): <5 U (ref <=6)
Rheumatoid Factor (IgM): <5 U (ref <=6)

## 2019-09-22 LAB — CK: Total CK: 289 U/L — ABNORMAL HIGH (ref 29–143)

## 2019-10-26 DIAGNOSIS — G4733 Obstructive sleep apnea (adult) (pediatric): Secondary | ICD-10-CM | POA: Diagnosis not present

## 2019-10-27 DIAGNOSIS — E039 Hypothyroidism, unspecified: Secondary | ICD-10-CM | POA: Diagnosis not present

## 2019-10-28 DIAGNOSIS — G4733 Obstructive sleep apnea (adult) (pediatric): Secondary | ICD-10-CM | POA: Diagnosis not present

## 2019-11-09 DIAGNOSIS — E039 Hypothyroidism, unspecified: Secondary | ICD-10-CM | POA: Diagnosis not present

## 2019-11-10 DIAGNOSIS — I10 Essential (primary) hypertension: Secondary | ICD-10-CM | POA: Diagnosis not present

## 2019-11-10 DIAGNOSIS — H00025 Hordeolum internum left lower eyelid: Secondary | ICD-10-CM | POA: Diagnosis not present

## 2019-11-25 DIAGNOSIS — G4733 Obstructive sleep apnea (adult) (pediatric): Secondary | ICD-10-CM | POA: Diagnosis not present

## 2019-12-05 DIAGNOSIS — R300 Dysuria: Secondary | ICD-10-CM | POA: Diagnosis not present

## 2019-12-26 DIAGNOSIS — G4733 Obstructive sleep apnea (adult) (pediatric): Secondary | ICD-10-CM | POA: Diagnosis not present

## 2020-02-01 DIAGNOSIS — F410 Panic disorder [episodic paroxysmal anxiety] without agoraphobia: Secondary | ICD-10-CM | POA: Diagnosis not present

## 2020-02-29 DIAGNOSIS — J069 Acute upper respiratory infection, unspecified: Secondary | ICD-10-CM | POA: Diagnosis not present

## 2020-02-29 DIAGNOSIS — J45909 Unspecified asthma, uncomplicated: Secondary | ICD-10-CM | POA: Diagnosis not present

## 2020-03-09 DIAGNOSIS — J9801 Acute bronchospasm: Secondary | ICD-10-CM | POA: Diagnosis not present

## 2020-03-21 DIAGNOSIS — H5213 Myopia, bilateral: Secondary | ICD-10-CM | POA: Diagnosis not present

## 2020-03-21 DIAGNOSIS — H524 Presbyopia: Secondary | ICD-10-CM | POA: Diagnosis not present

## 2020-03-27 DIAGNOSIS — F410 Panic disorder [episodic paroxysmal anxiety] without agoraphobia: Secondary | ICD-10-CM | POA: Diagnosis not present

## 2020-05-03 DIAGNOSIS — F410 Panic disorder [episodic paroxysmal anxiety] without agoraphobia: Secondary | ICD-10-CM | POA: Diagnosis not present

## 2020-05-07 DIAGNOSIS — F410 Panic disorder [episodic paroxysmal anxiety] without agoraphobia: Secondary | ICD-10-CM | POA: Diagnosis not present

## 2020-05-16 DIAGNOSIS — J4520 Mild intermittent asthma, uncomplicated: Secondary | ICD-10-CM | POA: Diagnosis not present

## 2020-05-16 DIAGNOSIS — G4733 Obstructive sleep apnea (adult) (pediatric): Secondary | ICD-10-CM | POA: Diagnosis not present

## 2020-05-16 DIAGNOSIS — F17210 Nicotine dependence, cigarettes, uncomplicated: Secondary | ICD-10-CM | POA: Diagnosis not present

## 2020-05-16 DIAGNOSIS — J3089 Other allergic rhinitis: Secondary | ICD-10-CM | POA: Diagnosis not present

## 2020-05-16 DIAGNOSIS — R0683 Snoring: Secondary | ICD-10-CM | POA: Diagnosis not present

## 2020-05-16 DIAGNOSIS — G4710 Hypersomnia, unspecified: Secondary | ICD-10-CM | POA: Diagnosis not present

## 2020-05-16 DIAGNOSIS — Z6839 Body mass index (BMI) 39.0-39.9, adult: Secondary | ICD-10-CM | POA: Diagnosis not present

## 2020-05-17 DIAGNOSIS — H0012 Chalazion right lower eyelid: Secondary | ICD-10-CM | POA: Diagnosis not present

## 2020-05-22 DIAGNOSIS — R319 Hematuria, unspecified: Secondary | ICD-10-CM | POA: Diagnosis not present

## 2020-05-22 DIAGNOSIS — R300 Dysuria: Secondary | ICD-10-CM | POA: Diagnosis not present

## 2020-05-22 DIAGNOSIS — R82998 Other abnormal findings in urine: Secondary | ICD-10-CM | POA: Diagnosis not present

## 2020-05-23 ENCOUNTER — Ambulatory Visit: Payer: Medicaid Other | Admitting: Sports Medicine

## 2020-05-28 ENCOUNTER — Ambulatory Visit (INDEPENDENT_AMBULATORY_CARE_PROVIDER_SITE_OTHER): Payer: Medicaid Other | Admitting: Sports Medicine

## 2020-05-28 DIAGNOSIS — G5603 Carpal tunnel syndrome, bilateral upper limbs: Secondary | ICD-10-CM

## 2020-05-28 DIAGNOSIS — M19079 Primary osteoarthritis, unspecified ankle and foot: Secondary | ICD-10-CM

## 2020-05-28 MED ORDER — MELOXICAM 15 MG PO TABS: ORAL_TABLET | ORAL | 3 refills | Status: DC

## 2020-05-28 NOTE — Assessment & Plan Note (Signed)
This is a very pleasant 45 year old female, she has a decades long history of numbness and tingling in both hands, thumb through the radial half of her ring finger, sparing the pinky. On exam she has no thenar atrophy, she does have positive Tinel's and Phalen signs all consistent with carpal tunnel syndrome, she will buy some over-the-counter nighttime splints, adding some rehab exercises, return to see me in 6 weeks, Hydro dissections if no better.

## 2020-05-28 NOTE — Progress Notes (Signed)
    Procedures performed today:    None.  Independent interpretation of notes and tests performed by another provider:   None.  Brief History, Exam, Impression, and Recommendations:    Carpal tunnel syndrome, bilateral This is a very pleasant 45 year old female, she has a decades long history of numbness and tingling in both hands, thumb through the radial half of her ring finger, sparing the pinky. On exam she has no thenar atrophy, she does have positive Tinel's and Phalen signs all consistent with carpal tunnel syndrome, she will buy some over-the-counter nighttime splints, adding some rehab exercises, return to see me in 6 weeks, Hydro dissections if no better.  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 She will purchase a Perriello's plate. Return to see me in 6 weeks, injection if no better.    ___________________________________________ Ihor Austin. Benjamin Stain, M.D., ABFM., CAQSM. Primary Care and Sports Medicine [facility]  Adjunct Instructor of Family Medicine  [employer]

## 2020-05-28 NOTE — Assessment & Plan Note (Signed)
Adding meloxicam, x-rays from prior are sufficient and confirm osteoarthritis. She will purchase a Barbary's plate. Return to see me in 6 weeks, injection if no better.

## 2020-06-07 DIAGNOSIS — E039 Hypothyroidism, unspecified: Secondary | ICD-10-CM | POA: Diagnosis not present

## 2020-07-02 DIAGNOSIS — I10 Essential (primary) hypertension: Secondary | ICD-10-CM | POA: Diagnosis not present

## 2020-07-02 DIAGNOSIS — R739 Hyperglycemia, unspecified: Secondary | ICD-10-CM | POA: Diagnosis not present

## 2020-07-02 DIAGNOSIS — Z79899 Other long term (current) drug therapy: Secondary | ICD-10-CM | POA: Diagnosis not present

## 2020-07-02 DIAGNOSIS — E559 Vitamin D deficiency, unspecified: Secondary | ICD-10-CM | POA: Diagnosis not present

## 2020-07-04 DIAGNOSIS — F900 Attention-deficit hyperactivity disorder, predominantly inattentive type: Secondary | ICD-10-CM | POA: Diagnosis not present

## 2020-07-04 DIAGNOSIS — J3089 Other allergic rhinitis: Secondary | ICD-10-CM | POA: Diagnosis not present

## 2020-07-04 DIAGNOSIS — R739 Hyperglycemia, unspecified: Secondary | ICD-10-CM | POA: Diagnosis not present

## 2020-07-04 DIAGNOSIS — R102 Pelvic and perineal pain: Secondary | ICD-10-CM | POA: Diagnosis not present

## 2020-07-04 DIAGNOSIS — I83813 Varicose veins of bilateral lower extremities with pain: Secondary | ICD-10-CM | POA: Diagnosis not present

## 2020-07-04 DIAGNOSIS — E039 Hypothyroidism, unspecified: Secondary | ICD-10-CM | POA: Diagnosis not present

## 2020-07-04 DIAGNOSIS — E559 Vitamin D deficiency, unspecified: Secondary | ICD-10-CM | POA: Diagnosis not present

## 2020-07-04 DIAGNOSIS — Z0000 Encounter for general adult medical examination without abnormal findings: Secondary | ICD-10-CM | POA: Diagnosis not present

## 2020-07-04 DIAGNOSIS — Z1231 Encounter for screening mammogram for malignant neoplasm of breast: Secondary | ICD-10-CM | POA: Diagnosis not present

## 2020-07-04 DIAGNOSIS — I10 Essential (primary) hypertension: Secondary | ICD-10-CM | POA: Diagnosis not present

## 2020-07-04 DIAGNOSIS — R319 Hematuria, unspecified: Secondary | ICD-10-CM | POA: Diagnosis not present

## 2020-07-04 DIAGNOSIS — F411 Generalized anxiety disorder: Secondary | ICD-10-CM | POA: Diagnosis not present

## 2020-07-09 ENCOUNTER — Ambulatory Visit: Payer: Medicaid Other | Admitting: Sports Medicine

## 2020-08-09 DIAGNOSIS — G4733 Obstructive sleep apnea (adult) (pediatric): Secondary | ICD-10-CM | POA: Diagnosis not present

## 2020-08-13 DIAGNOSIS — F410 Panic disorder [episodic paroxysmal anxiety] without agoraphobia: Secondary | ICD-10-CM | POA: Diagnosis not present

## 2020-08-27 IMAGING — DX DG FOOT COMPLETE 3+V*R*
3 series · 3 of 3 positions shown · non-contrast
Comparison: None.

CLINICAL DATA: Pain

EXAM:
RIGHT FOOT COMPLETE - 3+ VIEW

[foot ap]
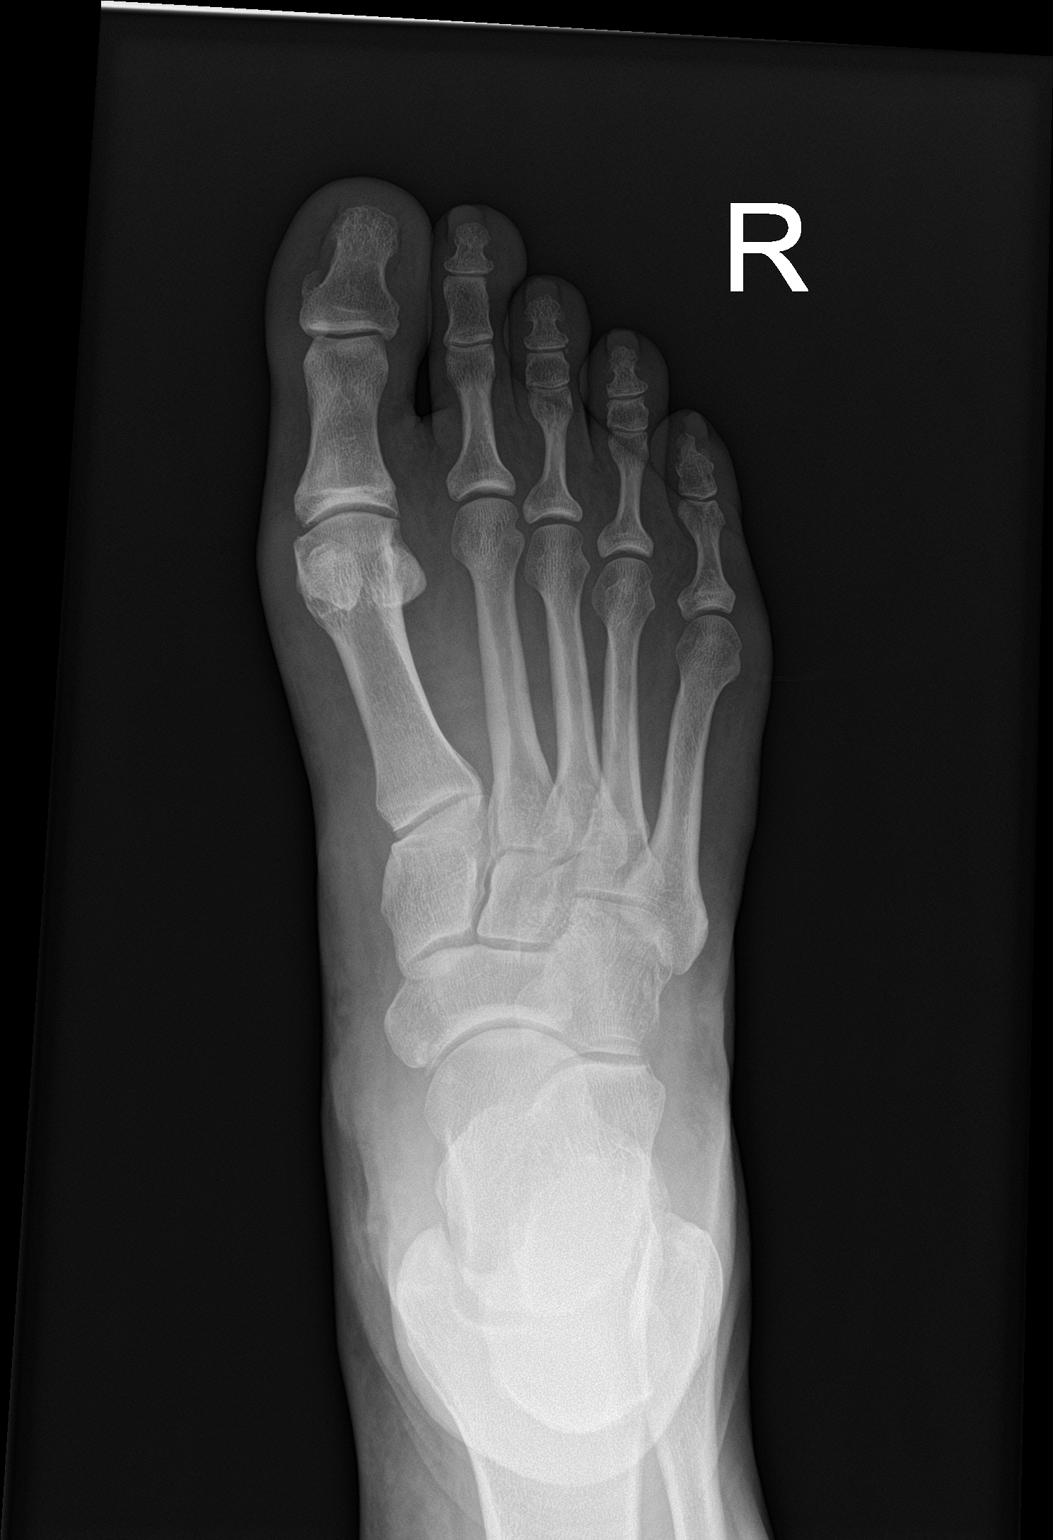

[foot obl]
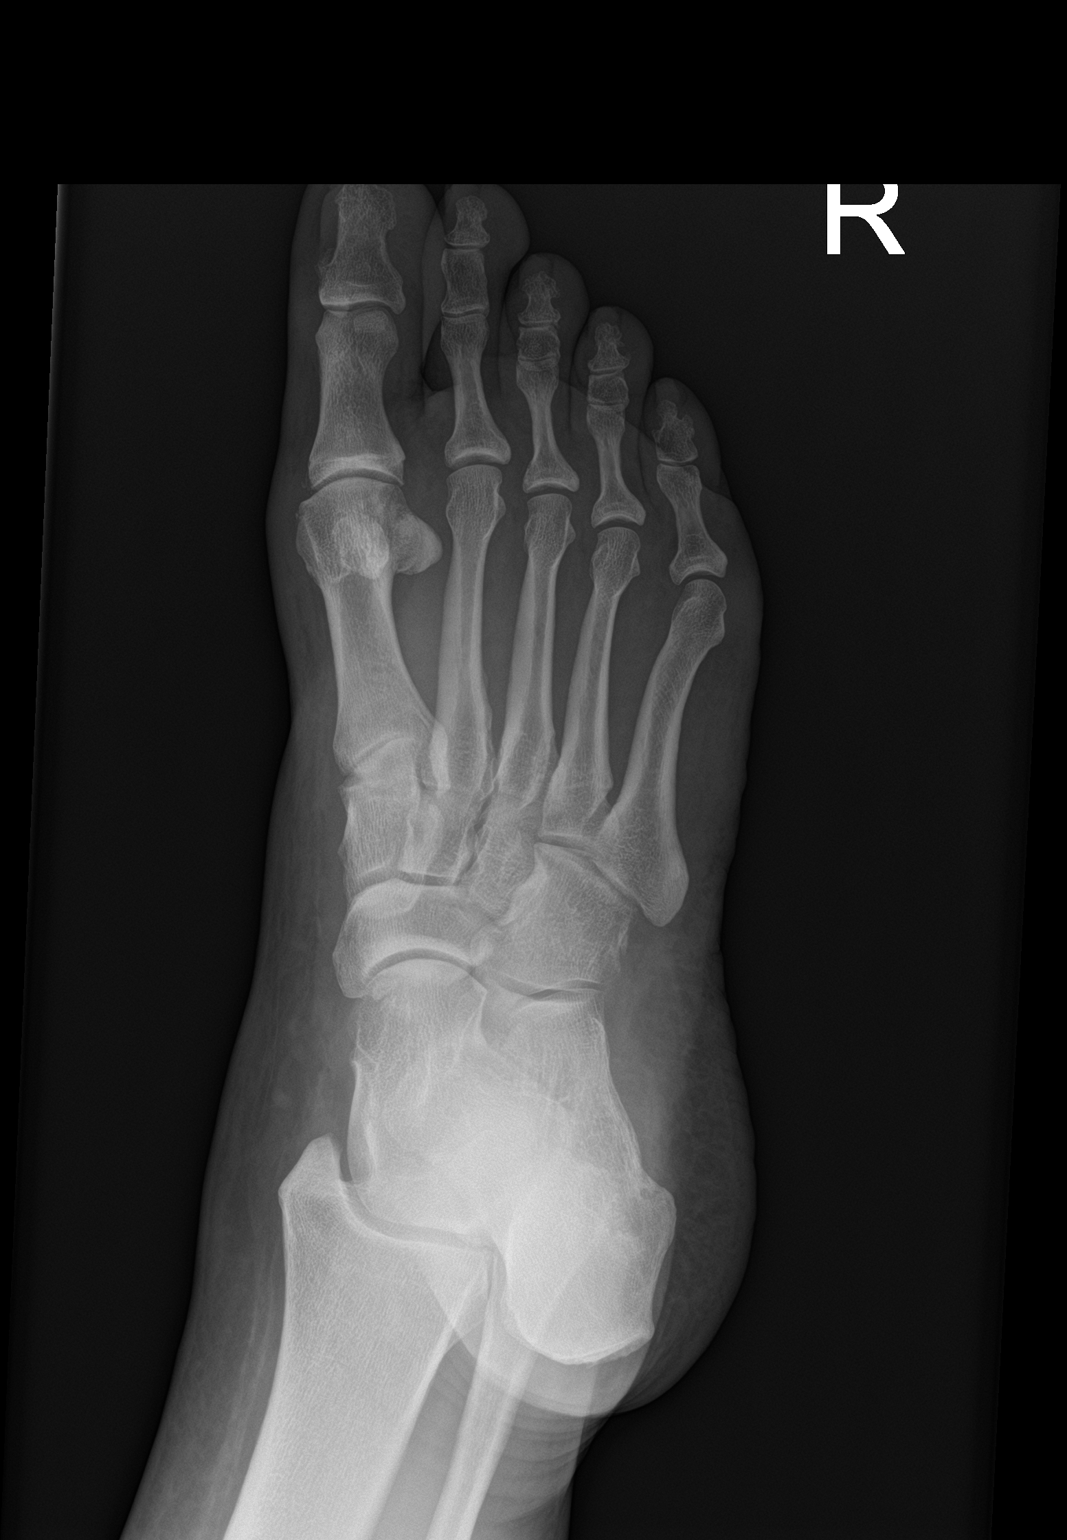

[foot lat]
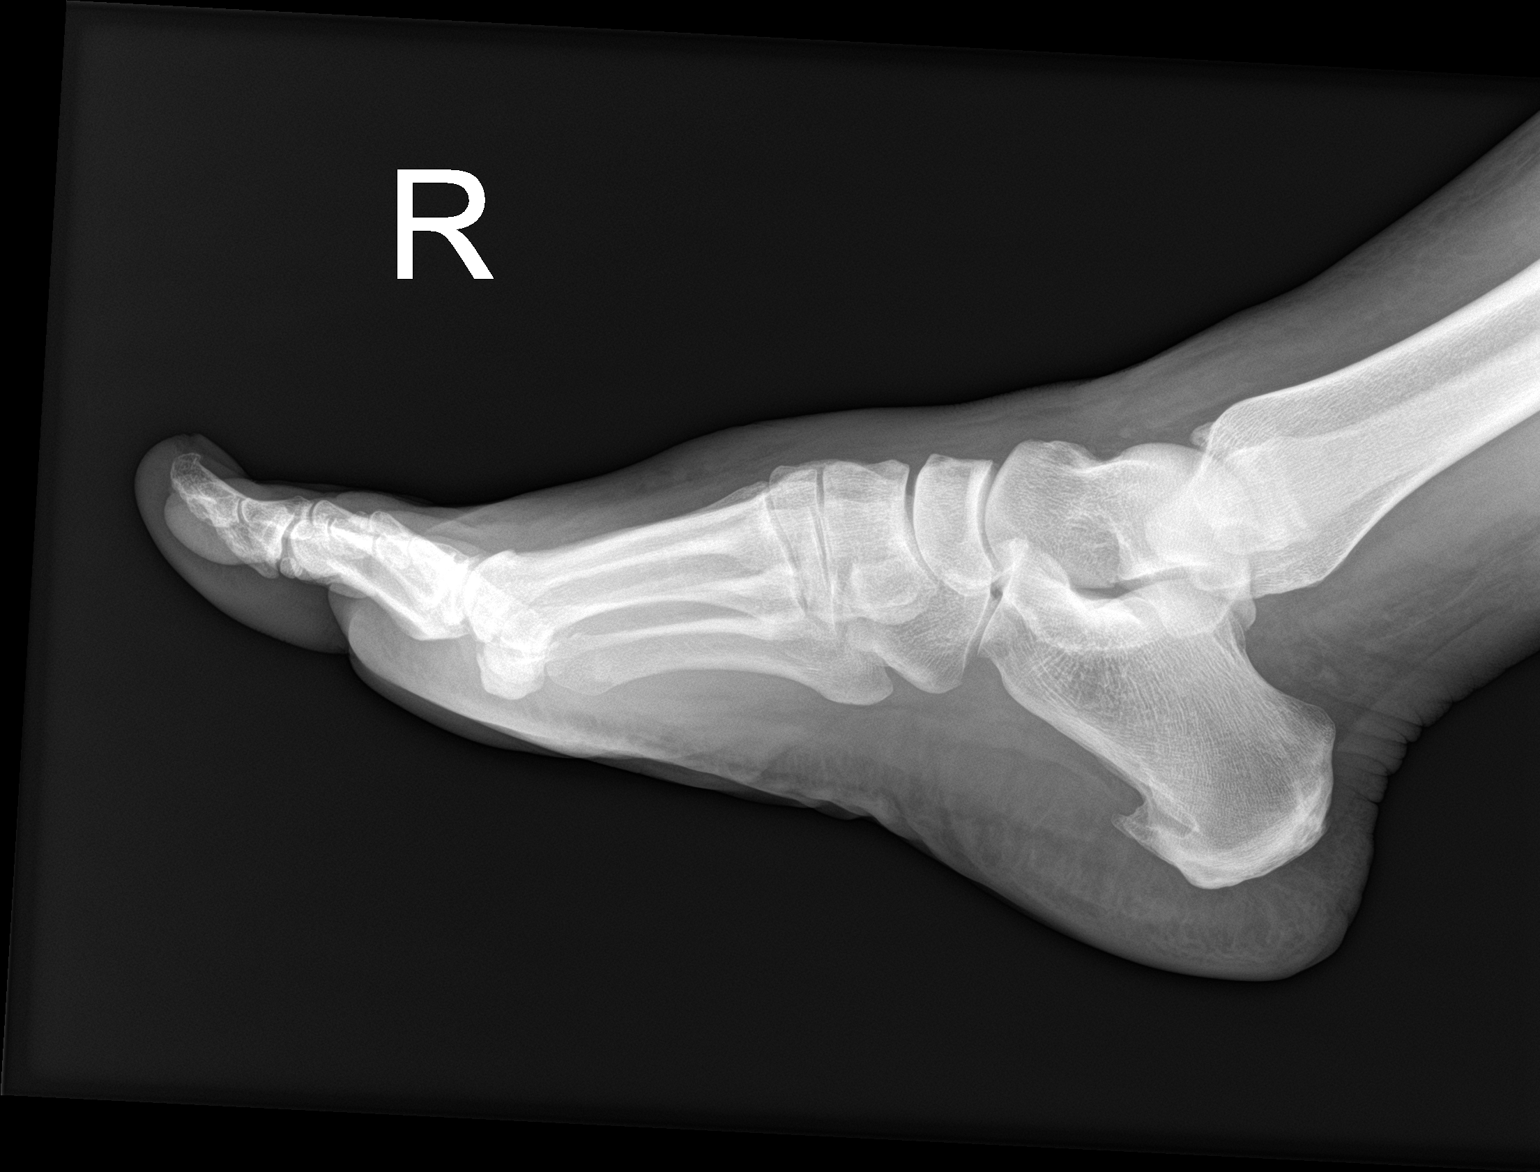

[3 of 3 positions shown; findings below may reference images not displayed]

FINDINGS: There is no evidence of fracture or dislocation. There is no
evidence of arthropathy or other focal bone abnormality. Soft
tissues are unremarkable.
IMPRESSION: Negative.

## 2020-10-22 DIAGNOSIS — F410 Panic disorder [episodic paroxysmal anxiety] without agoraphobia: Secondary | ICD-10-CM | POA: Diagnosis not present

## 2020-10-30 DIAGNOSIS — E039 Hypothyroidism, unspecified: Secondary | ICD-10-CM | POA: Diagnosis not present

## 2020-11-14 DIAGNOSIS — J4520 Mild intermittent asthma, uncomplicated: Secondary | ICD-10-CM | POA: Diagnosis not present

## 2020-11-14 DIAGNOSIS — G4733 Obstructive sleep apnea (adult) (pediatric): Secondary | ICD-10-CM | POA: Diagnosis not present

## 2020-11-14 DIAGNOSIS — J3089 Other allergic rhinitis: Secondary | ICD-10-CM | POA: Diagnosis not present

## 2020-11-14 DIAGNOSIS — J208 Acute bronchitis due to other specified organisms: Secondary | ICD-10-CM | POA: Diagnosis not present

## 2020-11-14 DIAGNOSIS — F17210 Nicotine dependence, cigarettes, uncomplicated: Secondary | ICD-10-CM | POA: Diagnosis not present

## 2020-11-20 DIAGNOSIS — F909 Attention-deficit hyperactivity disorder, unspecified type: Secondary | ICD-10-CM | POA: Diagnosis not present

## 2020-12-21 DIAGNOSIS — F410 Panic disorder [episodic paroxysmal anxiety] without agoraphobia: Secondary | ICD-10-CM | POA: Diagnosis not present

## 2021-01-07 DIAGNOSIS — E039 Hypothyroidism, unspecified: Secondary | ICD-10-CM | POA: Diagnosis not present

## 2021-01-07 DIAGNOSIS — Z1231 Encounter for screening mammogram for malignant neoplasm of breast: Secondary | ICD-10-CM | POA: Diagnosis not present

## 2021-01-07 DIAGNOSIS — F411 Generalized anxiety disorder: Secondary | ICD-10-CM | POA: Diagnosis not present

## 2021-01-07 DIAGNOSIS — E559 Vitamin D deficiency, unspecified: Secondary | ICD-10-CM | POA: Diagnosis not present

## 2021-01-07 DIAGNOSIS — F17210 Nicotine dependence, cigarettes, uncomplicated: Secondary | ICD-10-CM | POA: Diagnosis not present

## 2021-01-07 DIAGNOSIS — L0232 Furuncle of buttock: Secondary | ICD-10-CM | POA: Diagnosis not present

## 2021-01-07 DIAGNOSIS — I10 Essential (primary) hypertension: Secondary | ICD-10-CM | POA: Diagnosis not present

## 2021-01-07 DIAGNOSIS — G4733 Obstructive sleep apnea (adult) (pediatric): Secondary | ICD-10-CM | POA: Diagnosis not present

## 2021-01-24 DIAGNOSIS — Z419 Encounter for procedure for purposes other than remedying health state, unspecified: Secondary | ICD-10-CM | POA: Diagnosis not present

## 2021-02-24 DIAGNOSIS — Z419 Encounter for procedure for purposes other than remedying health state, unspecified: Secondary | ICD-10-CM | POA: Diagnosis not present

## 2021-02-26 DIAGNOSIS — F410 Panic disorder [episodic paroxysmal anxiety] without agoraphobia: Secondary | ICD-10-CM | POA: Diagnosis not present

## 2021-03-12 DIAGNOSIS — G4733 Obstructive sleep apnea (adult) (pediatric): Secondary | ICD-10-CM | POA: Diagnosis not present

## 2021-03-27 DIAGNOSIS — Z419 Encounter for procedure for purposes other than remedying health state, unspecified: Secondary | ICD-10-CM | POA: Diagnosis not present

## 2021-04-24 DIAGNOSIS — Z419 Encounter for procedure for purposes other than remedying health state, unspecified: Secondary | ICD-10-CM | POA: Diagnosis not present

## 2021-05-15 DIAGNOSIS — J3089 Other allergic rhinitis: Secondary | ICD-10-CM | POA: Diagnosis not present

## 2021-05-15 DIAGNOSIS — G4733 Obstructive sleep apnea (adult) (pediatric): Secondary | ICD-10-CM | POA: Diagnosis not present

## 2021-05-15 DIAGNOSIS — E6609 Other obesity due to excess calories: Secondary | ICD-10-CM | POA: Diagnosis not present

## 2021-05-15 DIAGNOSIS — Z6836 Body mass index (BMI) 36.0-36.9, adult: Secondary | ICD-10-CM | POA: Diagnosis not present

## 2021-05-15 DIAGNOSIS — R053 Chronic cough: Secondary | ICD-10-CM | POA: Diagnosis not present

## 2021-05-15 DIAGNOSIS — F17218 Nicotine dependence, cigarettes, with other nicotine-induced disorders: Secondary | ICD-10-CM | POA: Diagnosis not present

## 2021-05-25 DIAGNOSIS — Z419 Encounter for procedure for purposes other than remedying health state, unspecified: Secondary | ICD-10-CM | POA: Diagnosis not present

## 2021-05-28 DIAGNOSIS — F410 Panic disorder [episodic paroxysmal anxiety] without agoraphobia: Secondary | ICD-10-CM | POA: Diagnosis not present

## 2021-06-21 DIAGNOSIS — Z1231 Encounter for screening mammogram for malignant neoplasm of breast: Secondary | ICD-10-CM | POA: Diagnosis not present

## 2021-06-24 DIAGNOSIS — Z419 Encounter for procedure for purposes other than remedying health state, unspecified: Secondary | ICD-10-CM | POA: Diagnosis not present

## 2021-07-25 DIAGNOSIS — Z419 Encounter for procedure for purposes other than remedying health state, unspecified: Secondary | ICD-10-CM | POA: Diagnosis not present

## 2021-08-24 DIAGNOSIS — Z419 Encounter for procedure for purposes other than remedying health state, unspecified: Secondary | ICD-10-CM | POA: Diagnosis not present

## 2021-08-29 DIAGNOSIS — Z124 Encounter for screening for malignant neoplasm of cervix: Secondary | ICD-10-CM | POA: Diagnosis not present

## 2021-08-29 DIAGNOSIS — Z113 Encounter for screening for infections with a predominantly sexual mode of transmission: Secondary | ICD-10-CM | POA: Diagnosis not present

## 2021-08-29 DIAGNOSIS — Z0000 Encounter for general adult medical examination without abnormal findings: Secondary | ICD-10-CM | POA: Diagnosis not present

## 2021-09-09 DIAGNOSIS — G4733 Obstructive sleep apnea (adult) (pediatric): Secondary | ICD-10-CM | POA: Diagnosis not present

## 2021-09-24 DIAGNOSIS — Z419 Encounter for procedure for purposes other than remedying health state, unspecified: Secondary | ICD-10-CM | POA: Diagnosis not present

## 2021-10-01 DIAGNOSIS — F410 Panic disorder [episodic paroxysmal anxiety] without agoraphobia: Secondary | ICD-10-CM | POA: Diagnosis not present

## 2021-10-17 DIAGNOSIS — J3089 Other allergic rhinitis: Secondary | ICD-10-CM | POA: Diagnosis not present

## 2021-10-17 DIAGNOSIS — J4530 Mild persistent asthma, uncomplicated: Secondary | ICD-10-CM | POA: Diagnosis not present

## 2021-10-17 DIAGNOSIS — Z6835 Body mass index (BMI) 35.0-35.9, adult: Secondary | ICD-10-CM | POA: Diagnosis not present

## 2021-10-17 DIAGNOSIS — G4733 Obstructive sleep apnea (adult) (pediatric): Secondary | ICD-10-CM | POA: Diagnosis not present

## 2021-10-17 DIAGNOSIS — F17210 Nicotine dependence, cigarettes, uncomplicated: Secondary | ICD-10-CM | POA: Diagnosis not present

## 2021-10-25 DIAGNOSIS — Z419 Encounter for procedure for purposes other than remedying health state, unspecified: Secondary | ICD-10-CM | POA: Diagnosis not present

## 2021-11-11 DIAGNOSIS — E039 Hypothyroidism, unspecified: Secondary | ICD-10-CM | POA: Diagnosis not present

## 2021-11-24 DIAGNOSIS — Z419 Encounter for procedure for purposes other than remedying health state, unspecified: Secondary | ICD-10-CM | POA: Diagnosis not present

## 2021-11-28 DIAGNOSIS — H0260 Xanthelasma of unspecified eye, unspecified eyelid: Secondary | ICD-10-CM | POA: Diagnosis not present

## 2021-11-28 DIAGNOSIS — D225 Melanocytic nevi of trunk: Secondary | ICD-10-CM | POA: Diagnosis not present

## 2021-11-28 DIAGNOSIS — L814 Other melanin hyperpigmentation: Secondary | ICD-10-CM | POA: Diagnosis not present

## 2021-11-28 DIAGNOSIS — B360 Pityriasis versicolor: Secondary | ICD-10-CM | POA: Diagnosis not present

## 2021-11-28 DIAGNOSIS — D485 Neoplasm of uncertain behavior of skin: Secondary | ICD-10-CM | POA: Diagnosis not present

## 2021-12-03 DIAGNOSIS — E039 Hypothyroidism, unspecified: Secondary | ICD-10-CM | POA: Diagnosis not present

## 2021-12-24 DIAGNOSIS — D239 Other benign neoplasm of skin, unspecified: Secondary | ICD-10-CM | POA: Diagnosis not present

## 2021-12-25 DIAGNOSIS — Z419 Encounter for procedure for purposes other than remedying health state, unspecified: Secondary | ICD-10-CM | POA: Diagnosis not present

## 2021-12-31 DIAGNOSIS — F410 Panic disorder [episodic paroxysmal anxiety] without agoraphobia: Secondary | ICD-10-CM | POA: Diagnosis not present

## 2022-01-24 DIAGNOSIS — Z419 Encounter for procedure for purposes other than remedying health state, unspecified: Secondary | ICD-10-CM | POA: Diagnosis not present

## 2022-02-20 DIAGNOSIS — H5213 Myopia, bilateral: Secondary | ICD-10-CM | POA: Diagnosis not present

## 2022-02-24 DIAGNOSIS — Z419 Encounter for procedure for purposes other than remedying health state, unspecified: Secondary | ICD-10-CM | POA: Diagnosis not present

## 2022-02-27 DIAGNOSIS — N9089 Other specified noninflammatory disorders of vulva and perineum: Secondary | ICD-10-CM | POA: Diagnosis not present

## 2022-02-27 DIAGNOSIS — R3989 Other symptoms and signs involving the genitourinary system: Secondary | ICD-10-CM | POA: Diagnosis not present

## 2022-02-27 DIAGNOSIS — N898 Other specified noninflammatory disorders of vagina: Secondary | ICD-10-CM | POA: Diagnosis not present

## 2022-02-27 DIAGNOSIS — N900 Mild vulvar dysplasia: Secondary | ICD-10-CM | POA: Diagnosis not present

## 2022-02-27 DIAGNOSIS — Z975 Presence of (intrauterine) contraceptive device: Secondary | ICD-10-CM | POA: Diagnosis not present

## 2022-03-27 DIAGNOSIS — Z419 Encounter for procedure for purposes other than remedying health state, unspecified: Secondary | ICD-10-CM | POA: Diagnosis not present

## 2022-04-01 DIAGNOSIS — F410 Panic disorder [episodic paroxysmal anxiety] without agoraphobia: Secondary | ICD-10-CM | POA: Diagnosis not present

## 2022-04-18 DIAGNOSIS — G4733 Obstructive sleep apnea (adult) (pediatric): Secondary | ICD-10-CM | POA: Diagnosis not present

## 2022-04-25 DIAGNOSIS — Z419 Encounter for procedure for purposes other than remedying health state, unspecified: Secondary | ICD-10-CM | POA: Diagnosis not present

## 2022-05-26 DIAGNOSIS — Z419 Encounter for procedure for purposes other than remedying health state, unspecified: Secondary | ICD-10-CM | POA: Diagnosis not present

## 2022-05-29 DIAGNOSIS — G4733 Obstructive sleep apnea (adult) (pediatric): Secondary | ICD-10-CM | POA: Diagnosis not present

## 2022-06-03 ENCOUNTER — Encounter: Payer: Medicaid Other | Admitting: Family Medicine

## 2022-06-03 ENCOUNTER — Ambulatory Visit (INDEPENDENT_AMBULATORY_CARE_PROVIDER_SITE_OTHER): Payer: Medicaid Other | Admitting: Sports Medicine

## 2022-06-03 ENCOUNTER — Ambulatory Visit (INDEPENDENT_AMBULATORY_CARE_PROVIDER_SITE_OTHER): Payer: Medicaid Other

## 2022-06-03 DIAGNOSIS — M2011 Hallux valgus (acquired), right foot: Secondary | ICD-10-CM

## 2022-06-03 DIAGNOSIS — M5412 Radiculopathy, cervical region: Secondary | ICD-10-CM | POA: Diagnosis not present

## 2022-06-03 DIAGNOSIS — G5603 Carpal tunnel syndrome, bilateral upper limbs: Secondary | ICD-10-CM

## 2022-06-03 DIAGNOSIS — M79672 Pain in left foot: Secondary | ICD-10-CM

## 2022-06-03 DIAGNOSIS — M79671 Pain in right foot: Secondary | ICD-10-CM | POA: Diagnosis not present

## 2022-06-03 DIAGNOSIS — M542 Cervicalgia: Secondary | ICD-10-CM | POA: Diagnosis not present

## 2022-06-03 DIAGNOSIS — M7989 Other specified soft tissue disorders: Secondary | ICD-10-CM | POA: Diagnosis not present

## 2022-06-03 DIAGNOSIS — R200 Anesthesia of skin: Secondary | ICD-10-CM | POA: Diagnosis not present

## 2022-06-03 MED ORDER — MELOXICAM 15 MG PO TABS: ORAL_TABLET | ORAL | 3 refills | Status: DC

## 2022-06-03 NOTE — Assessment & Plan Note (Signed)
Several weeks of increasing dorsal left foot pain localized over the cuneiforms. X-rays, meloxicam, custom orthotics, return to see me in 4 weeks.

## 2022-06-03 NOTE — Progress Notes (Signed)
    Procedures performed today:    None.  Independent interpretation of notes and tests performed by another provider:   None.  Brief History, Exam, Impression, and Recommendations:    Radiculitis of left cervical region Neck pain with numbness left periscapular, radiation down the left arm. Adding neck x-rays, meloxicam, home conditioning. Return to see me in 6 weeks, MR for interventional planning if no better.  Hallux valgus, right Hallux valgus with first MTP osteoarthritis, moderate pain. Continue Keidel's plate with first ray extension, we will get some custom molded orthotics. I would also like her to consult with podiatry regarding bunion correction.  Left foot pain Several weeks of increasing dorsal left foot pain localized over the cuneiforms. X-rays, meloxicam, custom orthotics, return to see me in 4 weeks.  Carpal tunnel syndrome, bilateral Adding nighttime splinting, home physical therapy, return in 4 to 6 weeks, hydrodissections if not better.  I spent 40 minutes of total time managing this patient today, this includes chart review, face to face, and non-face to face time.  ____________________________________________ Ihor Austin. Benjamin Stain, M.D., ABFM., CAQSM., AME. Primary Care and Sports Medicine [facility] [facility]  Adjunct Professor of Family Medicine  [place] [employer]  Restaurant manager, fast food

## 2022-06-03 NOTE — Assessment & Plan Note (Addendum)
Adding nighttime splinting, home physical therapy, return in 4 to 6 weeks, hydrodissections if not better.

## 2022-06-03 NOTE — Assessment & Plan Note (Signed)
Hallux valgus with first MTP osteoarthritis, moderate pain. Continue Belfiore's plate with first ray extension, we will get some custom molded orthotics. I would also like her to consult with podiatry regarding bunion correction.

## 2022-06-03 NOTE — Assessment & Plan Note (Signed)
Neck pain with numbness left periscapular, radiation down the left arm. Adding neck x-rays, meloxicam, home conditioning. Return to see me in 6 weeks, MR for interventional planning if no better.

## 2022-06-05 ENCOUNTER — Encounter: Payer: Self-pay | Admitting: Family Medicine

## 2022-06-05 ENCOUNTER — Ambulatory Visit (INDEPENDENT_AMBULATORY_CARE_PROVIDER_SITE_OTHER): Payer: Medicaid Other | Admitting: Family Medicine

## 2022-06-05 VITALS — Ht 67.0 in | Wt 262.0 lb

## 2022-06-05 DIAGNOSIS — M19079 Primary osteoarthritis, unspecified ankle and foot: Secondary | ICD-10-CM

## 2022-06-05 NOTE — Progress Notes (Signed)
  Tracy Carr - 47 y.o. female MRN [id number]  Date of birth: 10-Nov-1975  SUBJECTIVE:  Including CC & ROS.  No chief complaint on file.   Tracy Carr is a 47 y.o. female that is presenting with right and left foot pain.  The pain has been ongoing for several weeks.  She has tried different modalities with no improvement.   Review of Systems See HPI   HISTORY: Past Medical, Surgical, Social, and Family History Reviewed & Updated per EMR.   Pertinent Historical Findings include:  Past Medical History:  Diagnosis Date   Depression    Thyroid disease     Past Surgical History:  Procedure Laterality Date   APPENDECTOMY     cesarean     HERNIA REPAIR       PHYSICAL EXAM:  VS: Ht 5\' 7"  (1.702 m)   Wt 262 lb (118.8 kg)   BMI 41.04 kg/m  Physical Exam Gen: NAD, alert, cooperative with exam, well-appearing MSK:  Neurovascularly intact    Patient was fitted for a standard, cushioned, semi-rigid orthotic. The orthotic was heated and afterward the patient stood on the orthotic blank positioned on the orthotic stand. The patient was positioned in subtalar neutral position and 10 degrees of ankle dorsiflexion in a weight bearing stance. After completion of molding, a stable base was applied to the orthotic blank. The blank was ground to a stable position for weight bearing. Size: 9 Pairs: 2 Base: Blue EVA Additional Posting and Padding: First ray post bilaterally, lateral heel wedge on right The patient ambulated these, and they were very comfortable.      ASSESSMENT & PLAN:   Primary osteoarthritis of right first metatarsophalangeal (MTP) joint Acute on chronic in nature.  Has obvious deformity.  She does tend to pronate when she walks. - counseled on home exercise therapy and supportive care - orthotics with first ray posting and lateral wedge.

## 2022-06-05 NOTE — Assessment & Plan Note (Signed)
Acute on chronic in nature.  Has obvious deformity.  She does tend to pronate when she walks. - counseled on home exercise therapy and supportive care - orthotics with first ray posting and lateral wedge.

## 2022-06-10 ENCOUNTER — Encounter: Payer: Self-pay | Admitting: Family Medicine

## 2022-06-10 ENCOUNTER — Ambulatory Visit: Payer: Medicaid Other | Admitting: Family Medicine

## 2022-06-10 DIAGNOSIS — J4530 Mild persistent asthma, uncomplicated: Secondary | ICD-10-CM | POA: Diagnosis not present

## 2022-06-10 DIAGNOSIS — Z6839 Body mass index (BMI) 39.0-39.9, adult: Secondary | ICD-10-CM | POA: Diagnosis not present

## 2022-06-10 DIAGNOSIS — G4733 Obstructive sleep apnea (adult) (pediatric): Secondary | ICD-10-CM | POA: Diagnosis not present

## 2022-06-11 ENCOUNTER — Encounter: Payer: Self-pay | Admitting: *Deleted

## 2022-06-12 ENCOUNTER — Encounter: Payer: Self-pay | Admitting: Podiatry

## 2022-06-12 ENCOUNTER — Ambulatory Visit (INDEPENDENT_AMBULATORY_CARE_PROVIDER_SITE_OTHER): Payer: Medicaid Other

## 2022-06-12 ENCOUNTER — Ambulatory Visit (INDEPENDENT_AMBULATORY_CARE_PROVIDER_SITE_OTHER): Payer: Medicaid Other | Admitting: Podiatry

## 2022-06-12 DIAGNOSIS — M21611 Bunion of right foot: Secondary | ICD-10-CM

## 2022-06-12 DIAGNOSIS — S93621S Sprain of tarsometatarsal ligament of right foot, sequela: Secondary | ICD-10-CM | POA: Diagnosis not present

## 2022-06-12 DIAGNOSIS — M205X1 Other deformities of toe(s) (acquired), right foot: Secondary | ICD-10-CM

## 2022-06-12 DIAGNOSIS — M2011 Hallux valgus (acquired), right foot: Secondary | ICD-10-CM | POA: Diagnosis not present

## 2022-06-12 DIAGNOSIS — M79671 Pain in right foot: Secondary | ICD-10-CM

## 2022-06-12 DIAGNOSIS — M7731 Calcaneal spur, right foot: Secondary | ICD-10-CM | POA: Diagnosis not present
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
## 2022-06-12 NOTE — Patient Instructions (Addendum)
Achilles Tendinitis  with Rehab Achilles tendinitis is a disorder of the Achilles tendon. The Achilles tendon connects the large calf muscles (Gastrocnemius and Soleus) to the heel bone (calcaneus). This tendon is sometimes called the heel cord. It is important for pushing-off and standing on your toes and is important for walking, running, or jumping. Tendinitis is often caused by overuse and repetitive microtrauma. SYMPTOMS  Pain, tenderness, swelling, warmth, and redness may occur over the Achilles tendon even at rest.  Pain with pushing off, or flexing or extending the ankle.  Pain that is worsened after or during activity. CAUSES   Overuse sometimes seen with rapid increase in exercise programs or in sports requiring running and jumping.  Poor physical conditioning (strength and flexibility or endurance).  Running sports, especially training running down hills.  Inadequate warm-up before practice or play or failure to stretch before participation.  Injury to the tendon. PREVENTION   Warm up and stretch before practice or competition.  Allow time for adequate rest and recovery between practices and competition.  Keep up conditioning.  Keep up ankle and leg flexibility.  Improve or keep muscle strength and endurance.  Improve cardiovascular fitness.  Use proper technique.  Use proper equipment (shoes, skates).  To help prevent recurrence, taping, protective strapping, or an adhesive bandage may be recommended for several weeks after healing is complete. PROGNOSIS   Recovery may take weeks to several months to heal.  Longer recovery is expected if symptoms have been prolonged.  Recovery is usually quicker if the inflammation is due to a direct blow as compared with overuse or sudden strain. RELATED COMPLICATIONS   Healing time will be prolonged if the condition is not correctly treated. The injury must be given plenty of time to heal.  Symptoms can reoccur if  activity is resumed too soon.  Untreated, tendinitis may increase the risk of tendon rupture requiring additional time for recovery and possibly surgery. TREATMENT   The first treatment consists of rest anti-inflammatory medication, and ice to relieve the pain.  Stretching and strengthening exercises after resolution of pain will likely help reduce the risk of recurrence. Referral to a physical therapist or athletic trainer for further evaluation and treatment may be helpful.  A walking boot or cast may be recommended to rest the Achilles tendon. This can help break the cycle of inflammation and microtrauma.  Arch supports (orthotics) may be prescribed or recommended by your caregiver as an adjunct to therapy and rest.  Surgery to remove the inflamed tendon lining or degenerated tendon tissue is rarely necessary and has shown less than predictable results. MEDICATION   Nonsteroidal anti-inflammatory medications, such as aspirin and ibuprofen, may be used for pain and inflammation relief. Do not take within 7 days before surgery. Take these as directed by your caregiver. Contact your caregiver immediately if any bleeding, stomach upset, or signs of allergic reaction occur. Other minor pain relievers, such as acetaminophen, may also be used.  Pain relievers may be prescribed as necessary by your caregiver. Do not take prescription pain medication for longer than 4 to 7 days. Use only as directed and only as much as you need.  Cortisone injections are rarely indicated. Cortisone injections may weaken tendons and predispose to rupture. It is better to give the condition more time to heal than to use them. HEAT AND COLD  Cold is used to relieve pain and reduce inflammation for acute and chronic Achilles tendinitis. Cold should be applied for 10 to 15 minutes   every 2 to 3 hours for inflammation and pain and immediately after any activity that aggravates your symptoms. Use ice packs or an ice  massage.  Heat may be used before performing stretching and strengthening activities prescribed by your caregiver. Use a heat pack or a warm soak. SEEK MEDICAL CARE IF:  Symptoms get worse or do not improve in 2 weeks despite treatment.  New, unexplained symptoms develop. Drugs used in treatment may produce side effects.  EXERCISES:  RANGE OF MOTION (ROM) AND STRETCHING EXERCISES - Achilles Tendinitis  These exercises may help you when beginning to rehabilitate your injury. Your symptoms may resolve with or without further involvement from your physician, physical therapist or athletic trainer. While completing these exercises, remember:   Restoring tissue flexibility helps normal motion to return to the joints. This allows healthier, less painful movement and activity.  An effective stretch should be held for at least 30 seconds.  A stretch should never be painful. You should only feel a gentle lengthening or release in the stretched tissue.  STRETCH  Gastroc, Standing   Place hands on wall.  Extend right / left leg, keeping the front knee somewhat bent.  Slightly point your toes inward on your back foot.  Keeping your right / left heel on the floor and your knee straight, shift your weight toward the wall, not allowing your back to arch.  You should feel a gentle stretch in the right / left calf. Hold this position for 10 seconds. Repeat 3 times. Complete this stretch 2 times per day.  STRETCH  Soleus, Standing   Place hands on wall.  Extend right / left leg, keeping the other knee somewhat bent.  Slightly point your toes inward on your back foot.  Keep your right / left heel on the floor, bend your back knee, and slightly shift your weight over the back leg so that you feel a gentle stretch deep in your back calf.  Hold this position for 10 seconds. Repeat 3 times. Complete this stretch 2 times per day.  STRETCH  Gastrocsoleus, Standing  Note: This exercise can place  a lot of stress on your foot and ankle. Please complete this exercise only if specifically instructed by your caregiver.   Place the ball of your right / left foot on a step, keeping your other foot firmly on the same step.  Hold on to the wall or a rail for balance.  Slowly lift your other foot, allowing your body weight to press your heel down over the edge of the step.  You should feel a stretch in your right / left calf.  Hold this position for 10 seconds.  Repeat this exercise with a slight bend in your knee. Repeat 3 times. Complete this stretch 2 times per day.   STRENGTHENING EXERCISES - Achilles Tendinitis These exercises may help you when beginning to rehabilitate your injury. They may resolve your symptoms with or without further involvement from your physician, physical therapist or athletic trainer. While completing these exercises, remember:   Muscles can gain both the endurance and the strength needed for everyday activities through controlled exercises.  Complete these exercises as instructed by your physician, physical therapist or athletic trainer. Progress the resistance and repetitions only as guided.  You may experience muscle soreness or fatigue, but the pain or discomfort you are trying to eliminate should never worsen during these exercises. If this pain does worsen, stop and make certain you are following the directions exactly. If   push your toes away from you, pointing them downward. Hold this position for 10 seconds. Return slowly, controlling the tension in the band/tubing. Repeat 3 times. Complete this exercise 2 times per day.   STRENGTH - Plantar-flexors  Stand with your feet shoulder width apart. Steady yourself with a wall or table  using as little support as needed. Keeping your weight evenly spread over the width of your feet, rise up on your toes.* Hold this position for 10 seconds. Repeat 3 times. Complete this exercise 2 times per day.  *If this is too easy, shift your weight toward your right / left leg until you feel challenged. Ultimately, you may be asked to do this exercise with your right / left foot only.  STRENGTH  Plantar-flexors, Eccentric  Note: This exercise can place a lot of stress on your foot and ankle. Please complete this exercise only if specifically instructed by your caregiver.  Place the balls of your feet on a step. With your hands, use only enough support from a wall or rail to keep your balance. Keep your knees straight and rise up on your toes. Slowly shift your weight entirely to your right / left toes and pick up your opposite foot. Gently and with controlled movement, lower your weight through your right / left foot so that your heel drops below the level of the step. You will feel a slight stretch in the back of your calf at the end position. Use the healthy leg to help rise up onto the balls of both feet, then lower weight only on the right / left leg again. Build up to 15 repetitions. Then progress to 3 consecutive sets of 15 repetitions.* After completing the above exercise, complete the same exercise with a slight knee bend (about 30 degrees). Again, build up to 15 repetitions. Then progress to 3 consecutive sets of 15 repetitions.* Perform this exercise 2 times per day.  *When you easily complete 3 sets of 15, your physician, physical therapist or athletic trainer may advise you to add resistance by wearing a backpack filled with additional weight.  STRENGTH - Plantar Flexors, Seated  Sit on a chair that allows your feet to rest flat on the ground. If necessary, sit at the edge of the chair. Keeping your toes firmly on the ground, lift your right / left heel as far as you can without  increasing any discomfort in your ankle. Repeat 3 times. Complete this exercise 2 times a day. Posterior Tibial Tendinitis Rehab Ask your health care provider which exercises are safe for you. Do exercises exactly as told by your health care provider and adjust them as directed. It is normal to feel mild stretching, pulling, tightness, or discomfort as you do these exercises. Stop right away if you feel sudden pain or your pain gets worse. Do not begin these exercises until told by your health care provider. Stretching and range-of-motion exercises These exercises warm up your muscles and joints and improve the movement and flexibility in your ankle and foot. These exercises may also help to relieve pain. Standing wall calf stretch, knee straight  Stand with your hands against a wall. Extend your left / right leg behind you, and bend your front knee slightly. If directed, place a folded washcloth under the arch of your foot for support. Point the toes of your back foot slightly inward. Keeping your heels on the floor and your back knee straight, shift your weight toward the wall. Do not allow your  back to arch. You should feel a gentle stretch in your upper left / right calf. Hold this position for __________ seconds. Repeat __________ times. Complete this exercise __________ times a day. Standing wall calf stretch, knee bent Stand with your hands against a wall. Extend your left / right leg behind you, and bend your front knee slightly. If directed, place a folded washcloth under the arch of your foot for support. Point the toes of your back foot slightly inward. Unlock your back knee so it is bent. Keep your heels on the floor. You should feel a gentle stretch deep in your lower left / right calf. Hold this position for __________ seconds. Repeat __________ times. Complete this exercise __________ times a day. Strengthening exercises These exercises build strength and endurance in your ankle  and foot. Endurance is the ability to use your muscles for a long time, even after they get tired. Ankle inversion with band Secure one end of a rubber exercise band or tubing to a fixed object, such as a table leg or a pole, that will stay still when the band is pulled. Loop the other end of the band around the middle of your left / right foot. Sit on the floor facing the object with your left / right leg extended. The band or tube should be slightly tense when your foot is relaxed. Leading with your big toe, slowly bring your left / right foot and ankle inward, toward your other foot (inversion). Hold this position for __________ seconds. Slowly return your foot to the starting position. Repeat __________ times. Complete this exercise __________ times a day. Towel curls  Sit in a chair on a non-carpeted surface, and put your feet on the floor. Place a towel in front of your feet. If told by your health care provider, add a __________ weight to the end of the towel. Keeping your heel on the floor, put your left / right foot on the towel. Pull the towel toward you by grabbing the towel with your toes and curling them under. Keep your heel on the floor while you do this. Let your toes relax. Grab the towel with your toes again. Keep going until the towel is completely underneath your foot. Repeat __________ times. Complete this exercise __________ times a day. Balance exercise This exercise improves or maintains your balance. Balance is important in preventing falls. Single leg stand Without wearing shoes, stand near a railing or in a doorway. You may hold on to the railing or door frame as needed for balance. Stand on your left / right foot. Keep your big toe down on the floor and try to keep your arch lifted. If balancing in this position is too easy, try the exercise with your eyes closed or while standing on a pillow. Hold this position for __________ seconds. Repeat __________ times.  Complete this exercise __________ times a day. This information is not intended to replace advice given to you by your health care provider. Make sure you discuss any questions you have with your health care provider. Document Revised: 06/08/2018 Document Reviewed: 04/14/2018 Elsevier Patient Education  2023 [employer].

## 2022-06-17 ENCOUNTER — Encounter: Payer: Self-pay | Admitting: Family Medicine

## 2022-06-17 ENCOUNTER — Ambulatory Visit (INDEPENDENT_AMBULATORY_CARE_PROVIDER_SITE_OTHER): Payer: Medicaid Other | Admitting: Family Medicine

## 2022-06-17 VITALS — Ht 67.0 in | Wt 262.0 lb

## 2022-06-17 DIAGNOSIS — M79672 Pain in left foot: Secondary | ICD-10-CM

## 2022-06-17 NOTE — Progress Notes (Signed)
  Tracy Carr - 47 y.o. female MRN [id number]  Date of birth: 1975-10-25  SUBJECTIVE:  Including CC & ROS.  No chief complaint on file.   Tracy Carr is a 47 y.o. female that is  following up for her orthotics.    Review of Systems See HPI   HISTORY: Past Medical, Surgical, Social, and Family History Reviewed & Updated per EMR.   Pertinent Historical Findings include:  Past Medical History:  Diagnosis Date   Depression    Thyroid disease     Past Surgical History:  Procedure Laterality Date   APPENDECTOMY     cesarean     HERNIA REPAIR       PHYSICAL EXAM:  VS: Ht  (1.702 m)   Wt 262 lb (118.8 kg)   BMI 41.04 kg/m  Physical Exam Gen: NAD, alert, cooperative with exam, well-appearing MSK:  Neurovascularly intact       ASSESSMENT & PLAN:   Left foot pain Provided a first ray post as well as a lateral wedge in the left orthotic.

## 2022-06-17 NOTE — Assessment & Plan Note (Signed)
Provided a first ray post as well as a lateral wedge in the left orthotic.

## 2022-06-24 ENCOUNTER — Telehealth: Payer: Self-pay | Admitting: *Deleted

## 2022-06-24 NOTE — Telephone Encounter (Addendum)
Patient is calling for CT status, can she been sent to [facility] instead of [facility] in [place]? Spoke with patient and she has no problems scheduling there,please advise.

## 2022-06-24 NOTE — Telephone Encounter (Signed)
Yes we can send her to [facility] Thanks

## 2022-06-25 DIAGNOSIS — Z419 Encounter for procedure for purposes other than remedying health state, unspecified: Secondary | ICD-10-CM | POA: Diagnosis not present

## 2022-06-26 DIAGNOSIS — A630 Anogenital (venereal) warts: Secondary | ICD-10-CM | POA: Diagnosis not present

## 2022-06-26 DIAGNOSIS — N9089 Other specified noninflammatory disorders of vulva and perineum: Secondary | ICD-10-CM | POA: Diagnosis not present

## 2022-06-26 DIAGNOSIS — Z8619 Personal history of other infectious and parasitic diseases: Secondary | ICD-10-CM | POA: Diagnosis not present

## 2022-06-26 NOTE — Telephone Encounter (Signed)
Sent to [facility] and has been scheduled.

## 2022-07-01 DIAGNOSIS — F410 Panic disorder [episodic paroxysmal anxiety] without agoraphobia: Secondary | ICD-10-CM | POA: Diagnosis not present

## 2022-07-07 ENCOUNTER — Other Ambulatory Visit (INDEPENDENT_AMBULATORY_CARE_PROVIDER_SITE_OTHER): Payer: Medicaid Other

## 2022-07-07 ENCOUNTER — Ambulatory Visit (INDEPENDENT_AMBULATORY_CARE_PROVIDER_SITE_OTHER): Payer: Medicaid Other | Admitting: Sports Medicine

## 2022-07-07 ENCOUNTER — Ambulatory Visit (INDEPENDENT_AMBULATORY_CARE_PROVIDER_SITE_OTHER): Payer: Medicaid Other

## 2022-07-07 DIAGNOSIS — G5603 Carpal tunnel syndrome, bilateral upper limbs: Secondary | ICD-10-CM

## 2022-07-07 DIAGNOSIS — Z09 Encounter for follow-up examination after completed treatment for conditions other than malignant neoplasm: Secondary | ICD-10-CM | POA: Diagnosis not present

## 2022-07-07 DIAGNOSIS — S8992XA Unspecified injury of left lower leg, initial encounter: Secondary | ICD-10-CM | POA: Diagnosis not present

## 2022-07-07 DIAGNOSIS — M1711 Unilateral primary osteoarthritis, right knee: Secondary | ICD-10-CM | POA: Diagnosis not present

## 2022-07-07 DIAGNOSIS — M2011 Hallux valgus (acquired), right foot: Secondary | ICD-10-CM | POA: Diagnosis not present

## 2022-07-07 DIAGNOSIS — M25462 Effusion, left knee: Secondary | ICD-10-CM | POA: Diagnosis not present

## 2022-07-07 DIAGNOSIS — M5412 Radiculopathy, cervical region: Secondary | ICD-10-CM

## 2022-07-07 NOTE — Assessment & Plan Note (Signed)
Persistent symptoms, has not yet started nighttime splinting, she will get this and return in 4 to 6 weeks.

## 2022-07-07 NOTE — Progress Notes (Signed)
    Procedures performed today:    Procedure: Real-time Ultrasound Guided aspiration/injection of left knee Device: Samsung HS60  Verbal informed consent obtained.  Time-out conducted.  Noted no overlying erythema, induration, or other signs of local infection.  Skin prepped in a sterile fashion.  Local anesthesia: Topical Ethyl chloride.  With sterile technique and under real time ultrasound guidance: Noted effusion, aspirated 25 mL of clear, straw-colored fluid, syringe switched and 1 cc Kenalog 40, 2 cc lidocaine, 2 cc bupivacaine injected easily Completed without difficulty  Advised to call if fevers/chills, erythema, induration, drainage, or persistent bleeding.  Images permanently stored and available for review in PACS.  Impression: Technically successful ultrasound guided aspiration/injection.  Independent interpretation of notes and tests performed by another provider:   None.  Brief History, Exam, Impression, and Recommendations:    Left knee injury Increasing pain at left knee, moderate effusion today. Aspiration and injection, adding some x-rays, return to see me in 6 weeks.  Carpal tunnel syndrome, bilateral Persistent symptoms, has not yet started nighttime splinting, she will get this and return in 4 to 6 weeks.  Hallux valgus, right Currently being comanaged with podiatry  Radiculitis of left cervical region Improved considerably with meloxicam and home PT, no further intervention needed.    ____________________________________________ Ihor Austin. Benjamin Stain, M.D., ABFM., CAQSM., AME. Primary Care and Sports Medicine [facility]  Adjunct Professor of Family Medicine  [employer]  Restaurant manager, [employer]

## 2022-07-07 NOTE — Assessment & Plan Note (Signed)
Improved considerably with meloxicam and home PT, no further intervention needed.

## 2022-07-07 NOTE — Assessment & Plan Note (Signed)
Currently being comanaged with podiatry

## 2022-07-07 NOTE — Assessment & Plan Note (Signed)
Increasing pain at left knee, moderate effusion today. Aspiration and injection, adding some x-rays, return to see me in 6 weeks.

## 2022-07-10 DIAGNOSIS — M546 Pain in thoracic spine: Secondary | ICD-10-CM | POA: Diagnosis not present

## 2022-07-10 DIAGNOSIS — R300 Dysuria: Secondary | ICD-10-CM | POA: Diagnosis not present

## 2022-07-10 DIAGNOSIS — R319 Hematuria, unspecified: Secondary | ICD-10-CM | POA: Diagnosis not present

## 2022-07-10 DIAGNOSIS — M47814 Spondylosis without myelopathy or radiculopathy, thoracic region: Secondary | ICD-10-CM | POA: Diagnosis not present

## 2022-07-10 DIAGNOSIS — I10 Essential (primary) hypertension: Secondary | ICD-10-CM | POA: Diagnosis not present

## 2022-07-18 ENCOUNTER — Encounter: Payer: Self-pay | Admitting: Podiatry

## 2022-07-26 DIAGNOSIS — Z419 Encounter for procedure for purposes other than remedying health state, unspecified: Secondary | ICD-10-CM | POA: Diagnosis not present

## 2022-07-28 ENCOUNTER — Ambulatory Visit
Admission: RE | Admit: 2022-07-28 | Discharge: 2022-07-28 | Disposition: A | Discharge status: Home / Self Care | Payer: Medicaid Other | Source: Ambulatory Visit | Attending: Podiatry | Admitting: Podiatry

## 2022-07-28 DIAGNOSIS — M79671 Pain in right foot: Secondary | ICD-10-CM | POA: Diagnosis not present

## 2022-07-28 DIAGNOSIS — M19071 Primary osteoarthritis, right ankle and foot: Secondary | ICD-10-CM | POA: Diagnosis not present

## 2022-07-28 DIAGNOSIS — M21611 Bunion of right foot: Secondary | ICD-10-CM

## 2022-08-01 DIAGNOSIS — G4733 Obstructive sleep apnea (adult) (pediatric): Secondary | ICD-10-CM | POA: Diagnosis not present

## 2022-08-18 ENCOUNTER — Ambulatory Visit: Payer: Medicaid Other | Admitting: Sports Medicine

## 2022-08-19 ENCOUNTER — Ambulatory Visit: Payer: Medicaid Other | Admitting: Sports Medicine

## 2022-08-25 DIAGNOSIS — G4733 Obstructive sleep apnea (adult) (pediatric): Secondary | ICD-10-CM | POA: Diagnosis not present

## 2022-08-25 DIAGNOSIS — J028 Acute pharyngitis due to other specified organisms: Secondary | ICD-10-CM | POA: Diagnosis not present

## 2022-08-25 DIAGNOSIS — J029 Acute pharyngitis, unspecified: Secondary | ICD-10-CM | POA: Diagnosis not present

## 2022-08-25 DIAGNOSIS — Z419 Encounter for procedure for purposes other than remedying health state, unspecified: Secondary | ICD-10-CM | POA: Diagnosis not present

## 2022-08-25 DIAGNOSIS — B9689 Other specified bacterial agents as the cause of diseases classified elsewhere: Secondary | ICD-10-CM | POA: Diagnosis not present

## 2022-09-15 DIAGNOSIS — G4733 Obstructive sleep apnea (adult) (pediatric): Secondary | ICD-10-CM | POA: Diagnosis not present

## 2022-09-25 DIAGNOSIS — G4733 Obstructive sleep apnea (adult) (pediatric): Secondary | ICD-10-CM | POA: Diagnosis not present

## 2022-09-25 DIAGNOSIS — Z419 Encounter for procedure for purposes other than remedying health state, unspecified: Secondary | ICD-10-CM | POA: Diagnosis not present

## 2022-09-30 ENCOUNTER — Other Ambulatory Visit: Payer: Self-pay | Admitting: Sports Medicine

## 2022-09-30 DIAGNOSIS — M5412 Radiculopathy, cervical region: Secondary | ICD-10-CM

## 2022-09-30 DIAGNOSIS — F410 Panic disorder [episodic paroxysmal anxiety] without agoraphobia: Secondary | ICD-10-CM | POA: Diagnosis not present

## 2023-10-29 ENCOUNTER — Encounter: Payer: Self-pay | Admitting: Sports Medicine

## 2024-03-02 ENCOUNTER — Ambulatory Visit
Admission: EM | Admit: 2024-03-02 | Discharge: 2024-03-02 | Disposition: A | Discharge status: Home / Self Care | Attending: Family Medicine | Admitting: Family Medicine

## 2024-03-02 DIAGNOSIS — J069 Acute upper respiratory infection, unspecified: Secondary | ICD-10-CM | POA: Diagnosis not present

## 2024-03-02 DIAGNOSIS — R059 Cough, unspecified: Secondary | ICD-10-CM

## 2024-03-02 MED ORDER — PREDNISONE 20 MG PO TABS: ORAL_TABLET | ORAL | 0 refills | Status: AC

## 2024-03-02 MED ORDER — AZITHROMYCIN 250 MG PO TABS: 250.0000 mg | ORAL_TABLET | Freq: Every day | ORAL | 0 refills | Status: AC

## 2024-03-02 NOTE — ED Provider Notes (Signed)
 " [facility]    CSN: [id number] Arrival date & time: 03/02/24  1430      History   Chief Complaint Chief Complaint  Patient presents with   Cough   Fatigue    HPI Tracy Carr is a 49 y.o. female.   HPI pleasant 49 year old female presents with non-productive cough and chest tightness for 4 days.  Patient reports feels like bronchitis to her.  PMH significant for morbid obesity, radiculitis of left cervical region, depression and thyroid  disease  Past Medical History:  Diagnosis Date   Depression    Thyroid  disease     Patient Active Problem List   Diagnosis Date Noted   Radiculitis of left cervical region 06/03/2022   Hallux valgus, right 06/03/2022   Left foot pain 06/03/2022   Carpal tunnel syndrome, bilateral 05/28/2020   Primary osteoarthritis of right first metatarsophalangeal (MTP) joint 05/28/2020   Closed fracture of first metatarsal base of right foot 09/19/2019   Polyarthralgia 09/19/2019   Left knee injury 01/07/2016   DISTURBANCE OF SKIN SENSATION 04/21/2008   Hypothyroidism 02/19/2008   Anxiety state 02/19/2008    Past Surgical History:  Procedure Laterality Date   APPENDECTOMY     cesarean     HERNIA REPAIR      OB History   No obstetric history on file.      Home Medications    Prior to Admission medications  Medication Sig Start Date End Date Taking? Authorizing Provider  azithromycin  (ZITHROMAX ) 250 MG tablet Take 1 tablet (250 mg total) by mouth daily. Take first 2 tablets together, then 1 every day until finished. 03/02/24  Yes Teddy Sharper, FNP  predniSONE  (DELTASONE ) 20 MG tablet Take 3 tabs PO daily x 5 days. 03/02/24  Yes Teddy Sharper, FNP  buPROPion (WELLBUTRIN XL) 150 MG 24 hr tablet Take 150 mg by mouth daily.    [provider]  levothyroxine (SYNTHROID, LEVOTHROID) 200 MCG tablet Take 275 mcg by mouth daily.      [provider]  meloxicam  (MOBIC ) 15 MG tablet TAKE 1 TABLET BY MOUTH ONCE DAILY  WITH MEALS FOR 2 WEEKS THEN 1 ONCE DAILY AS NEEDED FOR PAIN 09/30/22   Curtis Debby PARAS, MD  PARoxetine (PAXIL) 40 MG tablet Take 40 mg by mouth every morning.      [provider]    Family History Family History  Problem Relation Age of Onset   Diabetes Mother    Cancer Father        Lung    Social History Social History[1]   Allergies   Amoxicillin , Penicillins, and Sulfonamide derivatives   Review of Systems Review of Systems  HENT:  Positive for congestion.   Respiratory:  Positive for cough and chest tightness.   All other systems reviewed and are negative.    Physical Exam Triage Vital Signs ED Triage Vitals  Encounter Vitals Group     BP      Girls Systolic BP Percentile      Girls Diastolic BP Percentile      Boys Systolic BP Percentile      Boys Diastolic BP Percentile      Pulse      Resp      Temp      Temp src      SpO2      Weight      Height      Head Circumference      Peak Flow  Pain Score      Pain Loc      Pain Education      Exclude from Growth Chart    No data found.  Updated Vital Signs BP 137/85   Pulse 66   Temp 98.5 F (36.9 C) (Oral)   Resp 20   SpO2 93%   Physical Exam Vitals and nursing note reviewed.  Constitutional:      Appearance: Normal appearance. She is obese. She is ill-appearing.  HENT:     Head: Normocephalic and atraumatic.     Right Ear: Tympanic membrane, ear canal and external ear normal.     Left Ear: Tympanic membrane, ear canal and external ear normal.     Mouth/Throat:     Mouth: Mucous membranes are moist.     Pharynx: Oropharynx is clear.  Eyes:     Extraocular Movements: Extraocular movements intact.     Conjunctiva/sclera: Conjunctivae normal.     Pupils: Pupils are equal, round, and reactive to light.  Cardiovascular:     Rate and Rhythm: Normal rate and regular rhythm.     Heart sounds: Normal heart sounds.  Pulmonary:     Effort: Pulmonary effort is normal.     Breath  sounds: Normal breath sounds. No wheezing, rhonchi or rales.     Comments: Infrequent nonproductive cough on exam Musculoskeletal:        General: Normal range of motion.     Cervical back: Normal range of motion and neck supple.  Skin:    General: Skin is warm and dry.  Neurological:     General: No focal deficit present.     Mental Status: She is alert and oriented to person, place, and time. Mental status is at baseline.  Psychiatric:        Mood and Affect: Mood normal.        Behavior: Behavior normal.      UC Treatments / Results  Labs (all labs ordered are listed, but only abnormal results are displayed) Labs Reviewed - No data to display  EKG   Radiology No results found.  Procedures Procedures (including critical care time)  Medications Ordered in UC Medications - No data to display  Initial Impression / Assessment and Plan / UC Course  I have reviewed the triage vital signs and the nursing notes.  Pertinent labs & imaging results that were available during my care of the patient were reviewed by me and considered in my medical decision making (see chart for details).     MDM: 1.  Acute URI-Rx'd Zithromax : Take as directed; 2.  Cough, unspecified type-Rx'd prednisone  20 mg tablet: Take 3 tablets p.o. daily x 5-day. dvised patient to take medications as directed with food to completion.  Encouraged increase daily water intake to 64 ounces per day while taking these medications.  Advised if symptoms worsen and/or unresolved please follow-up with your PCP or here for further evaluation.  Discharged home, hemodynamically stable Final Clinical Impressions(s) / UC Diagnoses   Final diagnoses:  Cough, unspecified type  Acute URI     Discharge Instructions      Advised patient to take medications as directed with food to completion.  Encouraged increase daily water intake to 64 ounces per day while taking these medications.  Advised if symptoms worsen and/or  unresolved please follow-up with your PCP or here for further evaluation.     ED Prescriptions     Medication Sig Dispense Auth. Provider   azithromycin  (ZITHROMAX )  250 MG tablet Take 1 tablet (250 mg total) by mouth daily. Take first 2 tablets together, then 1 every day until finished. 6 tablet Stokes Rattigan, FNP   predniSONE  (DELTASONE ) 20 MG tablet Take 3 tabs PO daily x 5 days. 15 tablet Pasty Manninen, FNP      PDMP not reviewed this encounter.    [1]  Social History Tobacco Use   Smoking status: Every Day    Current packs/day: 1.00    Average packs/day: 1 pack/day for 25.0 years (25.0 ttl pk-yrs)    Types: Cigarettes   Smokeless tobacco: Never  Vaping Use   Vaping status: Never Used  Substance Use Topics   Alcohol use: Yes    Alcohol/week: 1.0 standard drink of alcohol    Types: 1 Shots of liquor per week   Drug use: No     Teddy Sharper, FNP 03/02/24 1519  "

## 2024-03-02 NOTE — ED Triage Notes (Signed)
 Pt c/o non-productive cough with chest tightness since Friday. States it feels similar to previous episodes of bronchitis. Has had fatigue since Monday. Denies any fever. No otc meds.

## 2024-03-02 NOTE — Discharge Instructions (Addendum)
 Advised patient to take medications as directed with food to completion.  Encouraged increase daily water intake to 64 ounces per day while taking these medications.  Advised if symptoms worsen and/or unresolved please follow-up with your PCP or here for further evaluation.
